# Patient Record
Sex: Male | Born: 1965 | Race: White | Hispanic: No | Marital: Married | Smoking: Former smoker
Health system: Southern US, Community
[De-identification: ages and names within clinical notes are randomized; demographics above are authoritative.]

## PROBLEM LIST (undated history)

## (undated) HISTORY — PX: FINGER SURGERY: SHX640

---
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
## 2008-10-07 ENCOUNTER — Emergency Department: Payer: Self-pay | Admitting: Emergency Medicine

## 2014-12-10 ENCOUNTER — Ambulatory Visit: Payer: Self-pay | Admitting: Family Medicine

## 2018-04-27 ENCOUNTER — Ambulatory Visit: Payer: Self-pay | Admitting: Family Medicine

## 2018-05-18 ENCOUNTER — Encounter: Payer: Self-pay | Admitting: Family Medicine

## 2018-05-18 ENCOUNTER — Encounter (INDEPENDENT_AMBULATORY_CARE_PROVIDER_SITE_OTHER): Payer: Self-pay

## 2018-05-18 ENCOUNTER — Ambulatory Visit (INDEPENDENT_AMBULATORY_CARE_PROVIDER_SITE_OTHER): Payer: BLUE CROSS/BLUE SHIELD | Admitting: Family Medicine

## 2018-05-18 VITALS — BP 170/96 | HR 75 | Temp 98.4°F | Ht 65.0 in | Wt 161.2 lb

## 2018-05-18 DIAGNOSIS — F411 Generalized anxiety disorder: Secondary | ICD-10-CM | POA: Diagnosis not present

## 2018-05-18 DIAGNOSIS — I10 Essential (primary) hypertension: Secondary | ICD-10-CM | POA: Diagnosis not present

## 2018-05-18 MED ORDER — LORAZEPAM 1 MG PO TABS: 0.5000 mg | ORAL_TABLET | Freq: Two times a day (BID) | ORAL | 0 refills | Status: DC | PRN

## 2018-05-18 MED ORDER — BUSPIRONE HCL 10 MG PO TABS: 10.0000 mg | ORAL_TABLET | Freq: Every day | ORAL | 3 refills | Status: DC

## 2018-05-18 MED ORDER — AMLODIPINE BESYLATE 5 MG PO TABS: 5.0000 mg | ORAL_TABLET | Freq: Every day | ORAL | 3 refills | Status: DC

## 2018-05-18 NOTE — Progress Notes (Signed)
BP (!) 170/96 (BP Location: Right Arm, Patient Position: Sitting, Cuff Size: Normal)   Pulse 75   Temp 98.4 F (36.9 C) (Oral)   Ht 5\' 5"  (1.651 m)   Wt 161 lb 4 oz (73.1 kg)   SpO2 97%   BMI 26.83 kg/m   BP 180/110 on recheck  CC: new pt to establish care Subjective:    Patient ID: Jonathon Gordon, male    DOB: 12/12/1965, 52 y.o.   MRN: [id number]  HPI: Jonathon Gordon is a 52 y.o. male presenting on 05/18/2018 for New Patient (Initial Visit) (Pt accompanied by fiance.)   Here with fiance Amy. No prior PCP. No recent CPE.   Takes calcium - suggested may stop this.   Elevated BP today - pt nervous. Fmhx HTN. Last time BP checked was 6 months ago. Occasional headaches. No vision changes, CP/tightness, SOB, leg swelling. High stress job.   High anxiety according to patient and fiancee. Started after bad divorce 8 yrs ago. Some anxiety attacks - racing heart, shortness of breath, tremors. Occurs during awkward situations. Happens 4-5 times a week. More stress at doctor's office, more stress with large crowds. Trying [facility] Wart OTC Family stress - trouble with son, lost father last year, mother in poor health. Stress relieving strategies. Upcoming vacation this week for 9 days. Tried neighbor's medicine which helped (unsure what it was).   High stress job at [employer].   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 Interim medical history since our last visit reviewed. Allergies and medications reviewed and updated. Outpatient Medications Prior to Visit  Medication Sig Dispense Refill  . Calcium Carbonate (CALCIUM OYSTER SHELL PO) Take 1 tablet by mouth  daily. 600 mg    . Cholecalciferol (VITAMIN D3) 2000 units TABS Take 1 tablet by mouth daily.    . Omega-3 Fatty Acids (OMEGA-3 FISH OIL) 1200 MG CAPS Take 1 capsule by mouth daily.    Marland Kitchen OVER THE COUNTER MEDICATION Take 2 capsules by mouth 2 (two) times daily. [facility] Wart 350 mg capsule    . OVER THE COUNTER MEDICATION Take 2 capsules by mouth 2 (two) times daily. Test X180 Alpha    . OVER THE COUNTER MEDICATION [employer] Mental Clarity     No facility-administered medications prior to visit.      Per HPI unless specifically indicated in ROS section below Review of Systems     Objective:    BP (!) 170/96 (BP Location: Right Arm, Patient Position: Sitting, Cuff Size: Normal)   Pulse 75   Temp 98.4 F (36.9 C) (Oral)   Ht 5\' 5"  (1.651 m)   Wt 161 lb 4 oz (73.1 kg)   SpO2 97%   BMI 26.83 kg/m   Wt Readings from Last 3 Encounters:  05/18/18 161 lb 4 oz (73.1 kg)    Physical Exam  Constitutional: He is oriented to person, place, and time. He appears well-developed and well-nourished. No distress.  HENT:  Head: Normocephalic and atraumatic.  Right Ear: Hearing, tympanic membrane, external ear and ear canal normal.  Left Ear: Hearing, tympanic membrane, external ear and ear canal normal.  Nose: Nose normal.  Mouth/Throat:  Uvula is midline, oropharynx is clear and moist and mucous membranes are normal. No oropharyngeal exudate, posterior oropharyngeal edema or posterior oropharyngeal erythema.  Eyes: Pupils are equal, round, and reactive to light. Conjunctivae and EOM are normal. No scleral icterus.  Neck: Normal range of motion. Neck supple.  Cardiovascular: Normal rate, regular rhythm, normal heart sounds and intact distal pulses.  No murmur heard. Pulses:      Radial pulses are 2+ on the right side, and 2+ on the left side.  Pulmonary/Chest: Effort normal and breath sounds normal. No respiratory distress. He has no wheezes. He has no rales.  Abdominal: Soft. Bowel  sounds are normal. He exhibits no distension and no mass. There is no tenderness. There is no rebound and no guarding.  Musculoskeletal: Normal range of motion. He exhibits no edema.  Lymphadenopathy:    He has no cervical adenopathy.  Neurological: He is alert and oriented to person, place, and time.  CN grossly intact, station and gait intact  Skin: Skin is warm and dry. No rash noted.  Psychiatric: He has a normal mood and affect. His behavior is normal. Judgment and thought content normal.  Nursing note and vitals reviewed.  No results found for this or any previous visit.   Depression screen PHQ 2/9 05/18/2018  Decreased Interest 1  Down, Depressed, Hopeless 1  PHQ - 2 Score 2  Altered sleeping 1  Tired, decreased energy 2  Change in appetite 1  Feeling bad or failure about yourself  2  Trouble concentrating 1  Moving slowly or fidgety/restless 0  Suicidal thoughts 0  PHQ-9 Score 9    GAD 7 : Generalized Anxiety Score 05/18/2018  Nervous, Anxious, on Edge 2  Control/stop worrying 2  Worry too much - different things 2  Trouble relaxing 2  Restless 1  Easily annoyed or irritable 2  Afraid - awful might happen 3  Total GAD 7 Score 14      Assessment & Plan:  Advised return in 1 month for f/u and CPE Over 45 minutes were spent face-to-face with the patient during this encounter and >50% of that time was spent on counseling and coordination of care  Problem List Items Addressed This Visit    Hypertension, essential - Primary    Endorses h/o elevated readings in the past, along with strong family history. Reviewed reasons to control blood pressure. I asked them to monitor BP more closely at home and if persistently elevated, to start amlodipine 5mg  daily - WASP provided today. Pt and fiancee agree with plan.       Relevant Medications   amLODipine (NORVASC) 5 MG tablet   GAD (generalized anxiety disorder)    Majority of visit spent discussing strsesors, anxiety, and  management of this. Reviewed in detail importance of stress relieving strategies, work- life balance, and healthy lifestyle to manage anxiety. Pt interested in further treatment for this - will Rx buspar 10mg  nightly over next few weeks, reviewed common side effects. Will Rx lorazepam PRN anxiety temporary measure while buspar takes effect. Discussed habit forming nature of this controlled substance. Encouraged sparing use. Recommended decreased alcohol use.  PHQ9 = 9 GAD7= 14      Relevant Medications   busPIRone (BUSPAR) 10 MG tablet   LORazepam (ATIVAN) 1 MG tablet       Meds ordered this encounter  Medications  . amLODipine (NORVASC) 5 MG tablet    Sig: Take 1 tablet (5 mg total) by mouth daily.  Dispense:  30 tablet    Refill:  3  . busPIRone (BUSPAR) 10 MG tablet    Sig: Take 1 tablet (10 mg total) by mouth at bedtime.    Dispense:  30 tablet    Refill:  3  . LORazepam (ATIVAN) 1 MG tablet    Sig: Take 0.5-1 tablets (0.5-1 mg total) by mouth 2 (two) times daily as needed for anxiety.    Dispense:  20 tablet    Refill:  0   No orders of the defined types were placed in this encounter.   Follow up plan: Return in about 1 month (around 06/15/2018) for annual exam, prior fasting for blood work.  Eustaquio Boyden, MD

## 2018-05-18 NOTE — Patient Instructions (Signed)
Good to meet you today. Work on stress relieving strategies as discussed Start buspar 10mg  nightly for anxiety - this will take several weeks to take full effect, let us know if not tolerating well. May take lorazepam 1/2-1 tablet as needed for anxiety.  Blood pressure is staying elevated - I've printed out prescription for amlodipine 5mg  daily - start taking if blood pressure stays elevated over the next week at home - buy blood pressure cuff. Increase water, increase potassium rich foods (fruits/vegetables), limit salt and sodium in the diet.   Hypertension Hypertension, commonly called high blood pressure, is when the force of blood pumping through the arteries is too strong. The arteries are the blood vessels that carry blood from the heart throughout the body. Hypertension forces the heart to work harder to pump blood and may cause arteries to become narrow or stiff. Having untreated or uncontrolled hypertension can cause heart attacks, strokes, kidney disease, and other problems. A blood pressure reading consists of a higher number over a lower number. Ideally, your blood pressure should be below 120/80. The first ("top") number is called the systolic pressure. It is a measure of the pressure in your arteries as your heart beats. The second ("bottom") number is called the diastolic pressure. It is a measure of the pressure in your arteries as the heart relaxes. What are the causes? The cause of this condition is not known. What increases the risk? Some risk factors for high blood pressure are under your control. Others are not. Factors you can change  Smoking.  Having type 2 diabetes mellitus, high cholesterol, or both.  Not getting enough exercise or physical activity.  Being overweight.  Having too much fat, sugar, calories, or salt (sodium) in your diet.  Drinking too much alcohol. Factors that are difficult or impossible to change  Having chronic kidney disease.  Having a  family history of high blood pressure.  Age. Risk increases with age.  Race. You may be at higher risk if you are African-American.  Gender. Men are at higher risk than women before age 54. After age 465, women are at higher risk than men.  Having obstructive sleep apnea.  Stress. What are the signs or symptoms? Extremely high blood pressure (hypertensive crisis) may cause:  Headache.  Anxiety.  Shortness of breath.  Nosebleed.  Nausea and vomiting.  Severe chest pain.  Jerky movements you cannot control (seizures).  How is this diagnosed? This condition is diagnosed by measuring your blood pressure while you are seated, with your arm resting on a surface. The cuff of the blood pressure monitor will be placed directly against the skin of your upper arm at the level of your heart. It should be measured at least twice using the same arm. Certain conditions can cause a difference in blood pressure between your right and left arms. Certain factors can cause blood pressure readings to be lower or higher than normal (elevated) for a short period of time:  When your blood pressure is higher when you are in a health care provider's office than when you are at home, this is called white coat hypertension. Most people with this condition do not need medicines.  When your blood pressure is higher at home than when you are in a health care provider's office, this is called masked hypertension. Most people with this condition may need medicines to control blood pressure.  If you have a high blood pressure reading during one visit or you have normal blood  pressure with other risk factors:  You may be asked to return on a different day to have your blood pressure checked again.  You may be asked to monitor your blood pressure at home for 1 week or longer.  If you are diagnosed with hypertension, you may have other blood or imaging tests to help your health care provider understand your  overall risk for other conditions. How is this treated? This condition is treated by making healthy lifestyle changes, such as eating healthy foods, exercising more, and reducing your alcohol intake. Your health care provider may prescribe medicine if lifestyle changes are not enough to get your blood pressure under control, and if:  Your systolic blood pressure is above 130.  Your diastolic blood pressure is above 80.  Your personal target blood pressure may vary depending on your medical conditions, your age, and other factors. Follow these instructions at home: Eating and drinking  Eat a diet that is high in fiber and potassium, and low in sodium, added sugar, and fat. An example eating plan is called the DASH (Dietary Approaches to Stop Hypertension) diet. To eat this way: ? Eat plenty of fresh fruits and vegetables. Try to fill half of your plate at each meal with fruits and vegetables. ? Eat whole grains, such as whole wheat pasta, brown rice, or whole grain bread. Fill about one quarter of your plate with whole grains. ? Eat or drink low-fat dairy products, such as skim milk or low-fat yogurt. ? Avoid fatty cuts of meat, processed or cured meats, and poultry with skin. Fill about one quarter of your plate with lean proteins, such as fish, chicken without skin, beans, eggs, and tofu. ? Avoid premade and processed foods. These tend to be higher in sodium, added sugar, and fat.  Reduce your daily sodium intake. Most people with hypertension should eat less than 1,500 mg of sodium a day.  Limit alcohol intake to no more than 1 drink a day for nonpregnant women and 2 drinks a day for men. One drink equals 12 oz of beer, 5 oz of wine, or 1 oz of hard liquor. Lifestyle  Work with your health care provider to maintain a healthy body weight or to lose weight. Ask what an ideal weight is for you.  Get at least 30 minutes of exercise that causes your heart to beat faster (aerobic exercise)  most days of the week. Activities may include walking, swimming, or biking.  Include exercise to strengthen your muscles (resistance exercise), such as pilates or lifting weights, as part of your weekly exercise routine. Try to do these types of exercises for 30 minutes at least 3 days a week.  Do not use any products that contain nicotine or tobacco, such as cigarettes and e-cigarettes. If you need help quitting, ask your health care provider.  Monitor your blood pressure at home as told by your health care provider.  Keep all follow-up visits as told by your health care provider. This is important. Medicines  Take over-the-counter and prescription medicines only as told by your health care provider. Follow directions carefully. Blood pressure medicines must be taken as prescribed.  Do not skip doses of blood pressure medicine. Doing this puts you at risk for problems and can make the medicine less effective.  Ask your health care provider about side effects or reactions to medicines that you should watch for. Contact a health care provider if:  You think you are having a reaction to a medicine  you are taking.  You have headaches that keep coming back (recurring).  You feel dizzy.  You have swelling in your ankles.  You have trouble with your vision. Get help right away if:  You develop a severe headache or confusion.  You have unusual weakness or numbness.  You feel faint.  You have severe pain in your chest or abdomen.  You vomit repeatedly.  You have trouble breathing. Summary  Hypertension is when the force of blood pumping through your arteries is too strong. If this condition is not controlled, it may put you at risk for serious complications.  Your personal target blood pressure may vary depending on your medical conditions, your age, and other factors. For most people, a normal blood pressure is less than 120/80.  Hypertension is treated with lifestyle changes,  medicines, or a combination of both. Lifestyle changes include weight loss, eating a healthy, low-sodium diet, exercising more, and limiting alcohol. This information is not intended to replace advice given to you by your health care provider. Make sure you discuss any questions you have with your health care provider. Document Released: 11/07/2005 Document Revised: 10/05/2016 Document Reviewed: 10/05/2016 Elsevier Interactive Patient Education  [employer].

## 2018-05-21 ENCOUNTER — Encounter: Payer: Self-pay | Admitting: Family Medicine

## 2018-05-21 DIAGNOSIS — F411 Generalized anxiety disorder: Secondary | ICD-10-CM

## 2018-05-21 DIAGNOSIS — I10 Essential (primary) hypertension: Secondary | ICD-10-CM | POA: Insufficient documentation

## 2018-05-21 HISTORY — DX: Generalized anxiety disorder: F41.1

## 2018-05-21 HISTORY — DX: Essential (primary) hypertension: I10

## 2018-05-21 NOTE — Assessment & Plan Note (Signed)
Endorses h/o elevated readings in the past, along with strong family history. Reviewed reasons to control blood pressure. I asked them to monitor BP more closely at home and if persistently elevated, to start amlodipine 5mg  daily - WASP provided today. Pt and fiancee agree with plan.

## 2018-05-21 NOTE — Assessment & Plan Note (Signed)
Majority of visit spent discussing strsesors, anxiety, and management of this. Reviewed in detail importance of stress relieving strategies, work- life balance, and healthy lifestyle to manage anxiety. Pt interested in further treatment for this - will Rx buspar 10mg  nightly over next few weeks, reviewed common side effects. Will Rx lorazepam PRN anxiety temporary measure while buspar takes effect. Discussed habit forming nature of this controlled substance. Encouraged sparing use. Recommended decreased alcohol use.  PHQ9 = 9 GAD7= 14

## 2018-06-14 ENCOUNTER — Other Ambulatory Visit: Payer: Self-pay | Admitting: Family Medicine

## 2018-06-14 DIAGNOSIS — I10 Essential (primary) hypertension: Secondary | ICD-10-CM

## 2018-06-14 DIAGNOSIS — Z13220 Encounter for screening for lipoid disorders: Secondary | ICD-10-CM

## 2018-06-14 DIAGNOSIS — Z125 Encounter for screening for malignant neoplasm of prostate: Secondary | ICD-10-CM

## 2018-06-15 ENCOUNTER — Other Ambulatory Visit (INDEPENDENT_AMBULATORY_CARE_PROVIDER_SITE_OTHER): Payer: BLUE CROSS/BLUE SHIELD

## 2018-06-15 DIAGNOSIS — I10 Essential (primary) hypertension: Secondary | ICD-10-CM | POA: Diagnosis not present

## 2018-06-15 DIAGNOSIS — Z125 Encounter for screening for malignant neoplasm of prostate: Secondary | ICD-10-CM

## 2018-06-15 DIAGNOSIS — Z13220 Encounter for screening for lipoid disorders: Secondary | ICD-10-CM

## 2018-06-15 LAB — LIPID PANEL
CHOLESTEROL: 193 mg/dL (ref 0–200)
HDL: 91.7 mg/dL (ref >39.00)
LDL CALC: 91 mg/dL (ref 0–99)
NonHDL: 101.28
TRIGLYCERIDES: 52 mg/dL (ref 0.0–149.0)
Total CHOL/HDL Ratio: 2
VLDL: 10.4 mg/dL (ref 0.0–40.0)

## 2018-06-15 LAB — PSA: PSA: 0.32 ng/mL (ref 0.10–4.00)

## 2018-06-15 LAB — MICROALBUMIN / CREATININE URINE RATIO
Creatinine,U: 53.1 mg/dL
Microalb Creat Ratio: 1.3 mg/g (ref 0.0–30.0)
Microalb, Ur: <0.7 mg/dL (ref 0.0–1.9)

## 2018-06-15 LAB — COMPREHENSIVE METABOLIC PANEL
ALBUMIN: 4.7 g/dL (ref 3.5–5.2)
ALT: 23 U/L (ref 0–53)
AST: 22 U/L (ref 0–37)
Alkaline Phosphatase: 67 U/L (ref 39–117)
BUN: 21 mg/dL (ref 6–23)
CALCIUM: 9.4 mg/dL (ref 8.4–10.5)
CO2: 26 mEq/L (ref 19–32)
Chloride: 100 mEq/L (ref 96–112)
Creatinine, Ser: 1.22 mg/dL (ref 0.40–1.50)
GFR: 65.76 mL/min (ref >60.00)
Glucose, Bld: 147 mg/dL — ABNORMAL HIGH (ref 70–99)
POTASSIUM: 4.2 meq/L (ref 3.5–5.1)
Sodium: 138 mEq/L (ref 135–145)
Total Bilirubin: 0.7 mg/dL (ref 0.2–1.2)
Total Protein: 7.6 g/dL (ref 6.0–8.3)

## 2018-06-19 ENCOUNTER — Ambulatory Visit (INDEPENDENT_AMBULATORY_CARE_PROVIDER_SITE_OTHER): Payer: BLUE CROSS/BLUE SHIELD | Admitting: Family Medicine

## 2018-06-19 ENCOUNTER — Encounter: Payer: Self-pay | Admitting: Family Medicine

## 2018-06-19 VITALS — BP 140/88 | HR 78 | Temp 98.2°F | Ht 65.0 in | Wt 161.8 lb

## 2018-06-19 DIAGNOSIS — Z23 Encounter for immunization: Secondary | ICD-10-CM

## 2018-06-19 DIAGNOSIS — Z0000 Encounter for general adult medical examination without abnormal findings: Secondary | ICD-10-CM | POA: Diagnosis not present

## 2018-06-19 DIAGNOSIS — R739 Hyperglycemia, unspecified: Secondary | ICD-10-CM

## 2018-06-19 DIAGNOSIS — Z1211 Encounter for screening for malignant neoplasm of colon: Secondary | ICD-10-CM

## 2018-06-19 DIAGNOSIS — F411 Generalized anxiety disorder: Secondary | ICD-10-CM

## 2018-06-19 DIAGNOSIS — I10 Essential (primary) hypertension: Secondary | ICD-10-CM

## 2018-06-19 DIAGNOSIS — R7303 Prediabetes: Secondary | ICD-10-CM | POA: Insufficient documentation

## 2018-06-19 LAB — POCT GLYCOSYLATED HEMOGLOBIN (HGB A1C): HEMOGLOBIN A1C: 5.2 % (ref 4.0–5.6)

## 2018-06-19 MED ORDER — BUSPIRONE HCL 10 MG PO TABS: 10.0000 mg | ORAL_TABLET | Freq: Two times a day (BID) | ORAL | 6 refills | Status: DC

## 2018-06-19 MED ORDER — LORAZEPAM 1 MG PO TABS: 0.5000 mg | ORAL_TABLET | Freq: Two times a day (BID) | ORAL | 0 refills | Status: DC | PRN

## 2018-06-19 MED ORDER — AMLODIPINE BESYLATE 5 MG PO TABS: 5.0000 mg | ORAL_TABLET | Freq: Every day | ORAL | 3 refills | Status: DC

## 2018-06-19 NOTE — Progress Notes (Signed)
BP 140/88 (BP Location: Right Arm, Patient Position: Sitting, Cuff Size: Normal)   Pulse 78   Temp 98.2 F (36.8 C) (Oral)   Ht 5\' 5"  (1.651 m)   Wt 161 lb 12 oz (73.4 kg)   SpO2 97%   BMI 26.92 kg/m    CC: CPE Subjective:    Patient ID: Jonathon Gordon, male    DOB: September 29, 1966, 52 y.o.   MRN: [id number]  HPI: Jonathon Gordon is a 52 y.o. male presenting on 06/19/2018 for Annual Exam (Wants to discuss lorazepam.)   See prior note for details. Last visit we started amlodipine for elevated blood pressures as well as buspar and lorazepam for GAD with high work stress. Overall doing well. 1 wk vacation helped.   HTN - bp mostly running 130 systolic. Tolerating amlodipine well.   Preventative: Colon cancer screening - discussed, would like colonoscopy Prostate cancer screening - discussed. Agrees to PSA/DRE today.  Flu shot - hasn't received Tdap today Seat belt use discussed Sunscreen use discussed. No changing moles on skin. 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 Interim medical history since our last visit reviewed. Allergies and medications reviewed and updated. Outpatient Medications Prior to Visit  Medication Sig Dispense Refill  . Cholecalciferol (VITAMIN D3) 2000 units TABS Take 1 tablet by mouth daily.    . Omega-3 Fatty Acids (OMEGA-3 FISH OIL) 1200 MG CAPS Take 1 capsule by mouth daily.    Marland Kitchen OVER THE COUNTER MEDICATION Take 2 capsules by mouth 2 (two) times daily. Test X180 Alpha    . amLODipine (NORVASC) 5 MG tablet Take 1 tablet (5 mg total) by mouth daily. 30 tablet 3  . busPIRone  (BUSPAR) 10 MG tablet Take 1 tablet (10 mg total) by mouth at bedtime. 30 tablet 3  . LORazepam (ATIVAN) 1 MG tablet Take 0.5-1 tablets (0.5-1 mg total) by mouth 2 (two) times daily as needed for anxiety. 20 tablet 0  . Calcium Carbonate (CALCIUM OYSTER SHELL PO) Take 1 tablet by mouth daily. 600 mg    . OVER THE COUNTER MEDICATION Take 2 capsules by mouth 2 (two) times daily. [facility] Wart 350 mg capsule    . OVER THE COUNTER MEDICATION [facility] Mental Clarity     No facility-administered medications prior to visit.      Per HPI unless specifically indicated in ROS section below Review of Systems  Constitutional: Negative for activity change, appetite change, chills, fatigue, fever and unexpected weight change.  HENT: Negative for hearing loss.   Eyes: Negative for visual disturbance.  Respiratory: Positive for cough (intermittent). Negative for chest tightness, shortness of breath and wheezing.   Cardiovascular: Positive for leg swelling (mild, amlodipine related). Negative for chest pain and palpitations.  Gastrointestinal: Negative for abdominal distention, abdominal pain, blood in stool, constipation, diarrhea, nausea and vomiting.  Genitourinary: Negative for difficulty urinating and hematuria.  Musculoskeletal: Negative for arthralgias, myalgias and neck pain.  Skin: Negative for rash.  Neurological: Negative for dizziness, seizures, syncope and headaches.  Hematological: Negative for adenopathy. Does not bruise/bleed easily.  Psychiatric/Behavioral: Negative for  dysphoric mood. The patient is not nervous/anxious.        Objective:    BP 140/88 (BP Location: Right Arm, Patient Position: Sitting, Cuff Size: Normal)   Pulse 78   Temp 98.2 F (36.8 C) (Oral)   Ht 5\' 5"  (1.651 m)   Wt 161 lb 12 oz (73.4 kg)   SpO2 97%   BMI 26.92 kg/m   Wt Readings from Last 3 Encounters:  06/19/18 161 lb 12 oz (73.4 kg)  05/18/18 161 lb 4 oz (73.1 kg)    Physical Exam    Constitutional: He is oriented to person, place, and time. He appears well-developed and well-nourished. No distress.  HENT:  Head: Normocephalic and atraumatic.  Right Ear: Hearing, tympanic membrane, external ear and ear canal normal.  Left Ear: Hearing, tympanic membrane, external ear and ear canal normal.  Nose: Nose normal.  Mouth/Throat: Uvula is midline, oropharynx is clear and moist and mucous membranes are normal. No oropharyngeal exudate, posterior oropharyngeal edema or posterior oropharyngeal erythema.  Eyes: Pupils are equal, round, and reactive to light. Conjunctivae and EOM are normal. No scleral icterus.  Neck: Normal range of motion. Neck supple.  Cardiovascular: Normal rate, regular rhythm, normal heart sounds and intact distal pulses.  No murmur heard. Pulses:      Radial pulses are 2+ on the right side, and 2+ on the left side.  Pulmonary/Chest: Effort normal and breath sounds normal. No respiratory distress. He has no wheezes. He has no rales.  Abdominal: Soft. Bowel sounds are normal. He exhibits no distension and no mass. There is no tenderness. There is no rebound and no guarding.  Genitourinary: Rectum normal and prostate normal. Rectal exam shows no external hemorrhoid, no internal hemorrhoid, no fissure, no mass, no tenderness and anal tone normal. Prostate is not enlarged (20gm) and not tender.  Musculoskeletal: Normal range of motion. He exhibits no edema.  Lymphadenopathy:    He has no cervical adenopathy.  Neurological: He is alert and oriented to person, place, and time.  CN grossly intact, station and gait intact  Skin: Skin is warm and dry. No rash noted.  Psychiatric: He has a normal mood and affect. His behavior is normal. Judgment and thought content normal.  Nursing note and vitals reviewed.  Results for orders placed or performed in visit on 06/15/18  Microalbumin / creatinine urine ratio  Result Value Ref Range   Microalb, Ur <0.7 0.0 - 1.9 mg/dL    Creatinine,U 16.1 mg/dL   Microalb Creat Ratio 1.3 0.0 - 30.0 mg/g  PSA  Result Value Ref Range   PSA 0.32 0.10 - 4.00 ng/mL  Lipid panel  Result Value Ref Range   Cholesterol 193 0 - 200 mg/dL   Triglycerides 09.6 0.0 - 149.0 mg/dL   HDL 04.54 >09.81 mg/dL   VLDL 19.1 0.0 - 47.8 mg/dL   LDL Cholesterol 91 0 - 99 mg/dL   Total CHOL/HDL Ratio 2    NonHDL 101.28   Comprehensive metabolic panel  Result Value Ref Range   Sodium 138 135 - 145 mEq/L   Potassium 4.2 3.5 - 5.1 mEq/L   Chloride 100 96 - 112 mEq/L   CO2 26 19 - 32 mEq/L   Glucose, Bld 147 (H) 70 - 99 mg/dL   BUN 21 6 - 23 mg/dL   Creatinine, Ser 2.95 0.40 - 1.50 mg/dL   Total Bilirubin 0.7 0.2 - 1.2 mg/dL   Alkaline Phosphatase 67 39 - 117 U/L   AST  22 0 - 37 U/L   ALT 23 0 - 53 U/L   Total Protein 7.6 6.0 - 8.3 g/dL   Albumin 4.7 3.5 - 5.2 g/dL   Calcium 9.4 8.4 - 16.110.5 mg/dL   GFR 09.6065.76 >45.40>60.00 mL/min      Assessment & Plan:   Problem List Items Addressed This Visit    Hypertension, essential    Marked improvement with healthy changes as well as amlodipine on board. Continue      Relevant Medications   amLODipine (NORVASC) 5 MG tablet   Hyperglycemia    New. Fasting cbg 147. Check A1c today.       Relevant Orders   POCT glycosylated hemoglobin (Hb A1C)   Health maintenance examination - Primary    Preventative protocols reviewed and updated unless pt declined. Discussed healthy diet and lifestyle.       GAD (generalized anxiety disorder)    Improvement on current regimen - will increase buspar to 5/10 x1 wk then 10mg  BID. Continue lorazepam. Aware goal is to taper off benzo.       Relevant Medications   busPIRone (BUSPAR) 10 MG tablet   LORazepam (ATIVAN) 1 MG tablet    Other Visit Diagnoses    Special screening for malignant neoplasms, colon       Relevant Orders   Ambulatory referral to Gastroenterology   Need for Tdap vaccination       Relevant Orders   Tdap vaccine greater than or equal  to 7yo IM (Completed)       Meds ordered this encounter  Medications  . busPIRone (BUSPAR) 10 MG tablet    Sig: Take 1 tablet (10 mg total) by mouth 2 (two) times daily.    Dispense:  60 tablet    Refill:  6  . amLODipine (NORVASC) 5 MG tablet    Sig: Take 1 tablet (5 mg total) by mouth daily.    Dispense:  90 tablet    Refill:  3  . LORazepam (ATIVAN) 1 MG tablet    Sig: Take 0.5-1 tablets (0.5-1 mg total) by mouth 2 (two) times daily as needed for anxiety.    Dispense:  20 tablet    Refill:  0   Orders Placed This Encounter  Procedures  . Tdap vaccine greater than or equal to 7yo IM  . Ambulatory referral to Gastroenterology    Referral Priority:   Routine    Referral Type:   Consultation    Referral Reason:   Specialty Services Required    Number of Visits Requested:   1  . POCT glycosylated hemoglobin (Hb A1C)    Follow up plan: No follow-ups on file.  Eustaquio BoydenJavier Zyan Coby, MD

## 2018-06-19 NOTE — Patient Instructions (Addendum)
Increase buspar to 1/2 tab in the morning and 1 tab at night.  Continue amlodipine 5mg  daily. I've refilled another month of lorazepam - goal is to eventually come off this medicine.  Continue working on healthy stress relieving strategies.  Tdap today (tetanus and whooping cough).  We will refer you to GI doctors in  [place].   Health Maintenance, Male A healthy lifestyle and preventive care is important for your health and wellness. Ask your health care provider about what schedule of regular examinations is right for you. What should I know about weight and diet? Eat a Healthy Diet  Eat plenty of vegetables, fruits, whole grains, low-fat dairy products, and lean protein.  Do not eat a lot of foods high in solid fats, added sugars, or salt.  Maintain a Healthy Weight Regular exercise can help you achieve or maintain a healthy weight. You should:  Do at least 150 minutes of exercise each week. The exercise should increase your heart rate and make you sweat (moderate-intensity exercise).  Do strength-training exercises at least twice a week.  Watch Your Levels of Cholesterol and Blood Lipids  Have your blood tested for lipids and cholesterol every 5 years starting at 54 years of age. If you are at high risk for heart disease, you should start having your blood tested when you are 54 years old. You may need to have your cholesterol levels checked more often if: ? Your lipid or cholesterol levels are high. ? You are older than 54 years of age. ? You are at high risk for heart disease.  What should I know about cancer screening? Many types of cancers can be detected early and may often be prevented. Lung Cancer  You should be screened every year for lung cancer if: ? You are a current smoker who has smoked for at least 30 years. ? You are a former smoker who has quit within the past 15 years.  Talk to your health care provider about your screening options, when you should start  screening, and how often you should be screened.  Colorectal Cancer  Routine colorectal cancer screening usually begins at 54 years of age and should be repeated every 5-10 years until you are 54 years old. You may need to be screened more often if early forms of precancerous polyps or small growths are found. Your health care provider may recommend screening at an earlier age if you have risk factors for colon cancer.  Your health care provider may recommend using home test kits to check for hidden blood in the stool.  A small camera at the end of a tube can be used to examine your colon (sigmoidoscopy or colonoscopy). This checks for the earliest forms of colorectal cancer.  Prostate and Testicular Cancer  Depending on your age and overall health, your health care provider may do certain tests to screen for prostate and testicular cancer.  Talk to your health care provider about any symptoms or concerns you have about testicular or prostate cancer.  Skin Cancer  Check your skin from head to toe regularly.  Tell your health care provider about any new moles or changes in moles, especially if: ? There is a change in a mole's size, shape, or color. ? You have a mole that is larger than a pencil eraser.  Always use sunscreen. Apply sunscreen liberally and repeat throughout the day.  Protect yourself by wearing long sleeves, pants, a wide-brimmed hat, and sunglasses when outside.  What should I  know about heart disease, diabetes, and high blood pressure?  If you are 75-55 years of age, have your blood pressure checked every 3-5 years. If you are 39 years of age or older, have your blood pressure checked every year. You should have your blood pressure measured twice-once when you are at a hospital or clinic, and once when you are not at a hospital or clinic. Record the average of the two measurements. To check your blood pressure when you are not at a hospital or clinic, you can use: ? An  automated blood pressure machine at a pharmacy. ? A home blood pressure monitor.  Talk to your health care provider about your target blood pressure.  If you are between 56-96 years old, ask your health care provider if you should take aspirin to prevent heart disease.  Have regular diabetes screenings by checking your fasting blood sugar level. ? If you are at a normal weight and have a low risk for diabetes, have this test once every three years after the age of 41. ? If you are overweight and have a high risk for diabetes, consider being tested at a younger age or more often.  A one-time screening for abdominal aortic aneurysm (AAA) by ultrasound is recommended for men aged 61-75 years who are current or former smokers. What should I know about preventing infection? Hepatitis B If you have a higher risk for hepatitis B, you should be screened for this virus. Talk with your health care provider to find out if you are at risk for hepatitis B infection. Hepatitis C Blood testing is recommended for:  Everyone born from [birth year] through 1967.  Anyone with known risk factors for hepatitis C.  Sexually Transmitted Diseases (STDs)  You should be screened each year for STDs including gonorrhea and chlamydia if: ? You are sexually active and are younger than 54 years of age. ? You are older than 54 years of age and your health care provider tells you that you are at risk for this type of infection. ? Your sexual activity has changed since you were last screened and you are at an increased risk for chlamydia or gonorrhea. Ask your health care provider if you are at risk.  Talk with your health care provider about whether you are at high risk of being infected with HIV. Your health care provider may recommend a prescription medicine to help prevent HIV infection.  What else can I do?  Schedule regular health, dental, and eye exams.  Stay current with your vaccines (immunizations).  Do not use  any tobacco products, such as cigarettes, chewing tobacco, and e-cigarettes. If you need help quitting, ask your health care provider.  Limit alcohol intake to no more than 2 drinks per day. One drink equals 12 ounces of beer, 5 ounces of wine, or 1 ounces of hard liquor.  Do not use street drugs.  Do not share needles.  Ask your health care provider for help if you need support or information about quitting drugs.  Tell your health care provider if you often feel depressed.  Tell your health care provider if you have ever been abused or do not feel safe at home. This information is not intended to replace advice given to you by your health care provider. Make sure you discuss any questions you have with your health care provider. Document Released: 05/05/2008 Document Revised: 07/06/2016 Document Reviewed: 08/11/2015 Elsevier Interactive Patient Education  [employer].

## 2018-06-19 NOTE — Assessment & Plan Note (Signed)
Improvement on current regimen - will increase buspar to 5/10 x1 wk then 10mg  BID. Continue lorazepam. Aware goal is to taper off benzo.

## 2018-06-19 NOTE — Assessment & Plan Note (Signed)
Preventative protocols reviewed and updated unless pt declined. Discussed healthy diet and lifestyle.  

## 2018-06-19 NOTE — Assessment & Plan Note (Signed)
Marked improvement with healthy changes as well as amlodipine on board. Continue

## 2018-06-19 NOTE — Assessment & Plan Note (Signed)
New. Fasting cbg 147. Check A1c today.

## 2018-07-05 ENCOUNTER — Encounter: Payer: Self-pay | Admitting: *Deleted

## 2018-07-26 ENCOUNTER — Other Ambulatory Visit: Payer: Self-pay | Admitting: Family Medicine

## 2018-07-26 NOTE — Telephone Encounter (Addendum)
I spoke with Joni Reining at [facility] and she inactivated old buspar rx. Joni Reining did have the 06/19/18 Buspar rx and would fill today. Pt notified and voiced understanding and very appreciate.

## 2018-07-26 NOTE — Telephone Encounter (Signed)
Attempted to contact pt regarding refill request for buspirone; records show prescription ordered 06/19/18 #60 with 6 refills; message left on voicemail [phone] for pt to contact pharmacy for refill; also left instructions to call office if any further questions.

## 2018-07-26 NOTE — Telephone Encounter (Signed)
Copied from CRM [id number]. Topic: Quick Communication - Rx Refill/Question >> Jul 26, 2018  1:05 PM Tamela Oddi wrote: Medication: busPIRone (BUSPAR) 10 MG tablet  Patient called to request a refill for the above medication.  CB# [phone]  Preferred Pharmacy (with phone number or street name): [phone], [place] - [address] [phone] (Phone) [phone] (Fax)

## 2018-07-26 NOTE — Telephone Encounter (Signed)
Patient states that the pharmacy has an old prescription to take just one a day and too soon to refill. He is taking two a day per Dr Reece Agar about a month ago at his last visit. Please advise.

## 2018-08-20 ENCOUNTER — Other Ambulatory Visit: Payer: Self-pay | Admitting: Family Medicine

## 2018-08-27 ENCOUNTER — Other Ambulatory Visit: Payer: Self-pay | Admitting: Family Medicine

## 2018-08-27 NOTE — Telephone Encounter (Signed)
Requested medication (s) are due for refill today: Yes  Requested medication (s) are on the active medication list: Yes  Last refill:  06/19/18  Future visit scheduled: No  Notes to clinic:  See request    Requested Prescriptions  Pending Prescriptions Disp Refills   LORazepam (ATIVAN) 1 MG tablet 20 tablet 0    Sig: Take 0.5-1 tablets (0.5-1 mg total) by mouth 2 (two) times daily as needed for anxiety.     Not Delegated - Psychiatry:  Anxiolytics/Hypnotics Failed - 08/27/2018  2:05 PM      Failed - This refill cannot be delegated      Failed - Urine Drug Screen completed in last 360 days.      Passed - Valid encounter within last 6 months    Recent Outpatient Visits          2 months ago Health maintenance examination   [facility], Wynona Canes, MD   3 months ago Hypertension, essential   [facility], Wynona Canes, MD

## 2018-08-27 NOTE — Telephone Encounter (Signed)
Name of Medication:Lorazepam 1 mg Name of Pharmacy: [facility] Last Fill or Written Date and Quantity: # 20 on 06/19/18 Last Office Visit and Type: 06/19/18 annual Next Office Visit and Type: none scheduled

## 2018-08-27 NOTE — Telephone Encounter (Signed)
Copied from CRM [id number]. Topic: Quick Communication - Rx Refill/Question >> Aug 27, 2018  1:51 PM Stephannie Li, NT wrote: Medication LORazepam (ATIVAN) 1 MG tablet  Has the patient contacted their pharmacy? no (Agent: If no, request that the patient contact the pharmacy for the refill. (Agent: If yes, when and what did the pharmacy advise?  Preferred Pharmacy (with phone number or street name  [phone], [place] - [address] [phone] (Phone) [phone] (Fax)    Agent: Please be advised that RX refills may take up to 3 business days. We ask that you follow-up with your pharmacy.

## 2018-08-29 MED ORDER — LORAZEPAM 1 MG PO TABS: 0.5000 mg | ORAL_TABLET | Freq: Two times a day (BID) | ORAL | 0 refills | Status: DC | PRN

## 2018-08-29 NOTE — Telephone Encounter (Signed)
Pt notified lorazepam sent electronically to [facility]. Pt voiced understanding.

## 2018-08-29 NOTE — Telephone Encounter (Signed)
Eprescribed.

## 2018-11-10 ENCOUNTER — Other Ambulatory Visit: Payer: Self-pay | Admitting: Family Medicine

## 2018-11-12 NOTE — Telephone Encounter (Signed)
Name of Medication: lorazepam 1 mg Name of Pharmacy:[facility]  Last Fill or Written Date and Quantity:# 20 on 08/29/18 Last Office Visit and Type: 06/19/18 annual Next Office Visit and Type: none scheduled

## 2018-11-13 NOTE — Telephone Encounter (Signed)
Eprescribed.

## 2019-02-02 ENCOUNTER — Other Ambulatory Visit: Payer: Self-pay | Admitting: Family Medicine

## 2019-02-04 NOTE — Telephone Encounter (Signed)
Last filled 11-13-18 #20 Last OV 06-19-18 No Future OV [facility]

## 2019-02-05 NOTE — Telephone Encounter (Signed)
Eprescribed.

## 2019-02-18 ENCOUNTER — Other Ambulatory Visit: Payer: Self-pay | Admitting: Family Medicine

## 2019-05-16 ENCOUNTER — Encounter: Payer: Self-pay | Admitting: Family Medicine

## 2019-05-16 ENCOUNTER — Ambulatory Visit (INDEPENDENT_AMBULATORY_CARE_PROVIDER_SITE_OTHER): Payer: BC Managed Care – PPO | Admitting: Family Medicine

## 2019-05-16 ENCOUNTER — Other Ambulatory Visit: Payer: Self-pay

## 2019-05-16 ENCOUNTER — Ambulatory Visit (INDEPENDENT_AMBULATORY_CARE_PROVIDER_SITE_OTHER)
Admission: RE | Admit: 2019-05-16 | Discharge: 2019-05-16 | Disposition: A | Discharge status: Home / Self Care | Payer: BC Managed Care – PPO | Source: Ambulatory Visit | Attending: Family Medicine | Admitting: Family Medicine

## 2019-05-16 VITALS — BP 124/72 | HR 96 | Temp 98.7°F | Ht 65.0 in | Wt 177.3 lb

## 2019-05-16 DIAGNOSIS — M7062 Trochanteric bursitis, left hip: Secondary | ICD-10-CM

## 2019-05-16 DIAGNOSIS — M79645 Pain in left finger(s): Secondary | ICD-10-CM | POA: Diagnosis not present

## 2019-05-16 DIAGNOSIS — M1812 Unilateral primary osteoarthritis of first carpometacarpal joint, left hand: Secondary | ICD-10-CM | POA: Insufficient documentation

## 2019-05-16 DIAGNOSIS — M19042 Primary osteoarthritis, left hand: Secondary | ICD-10-CM | POA: Diagnosis not present

## 2019-05-16 DIAGNOSIS — M7072 Other bursitis of hip, left hip: Secondary | ICD-10-CM | POA: Insufficient documentation

## 2019-05-16 NOTE — Progress Notes (Signed)
This visit was conducted in person.  BP 124/72 (BP Location: Left Arm, Patient Position: Sitting, Cuff Size: Normal)   Pulse 96   Temp 98.7 F (37.1 C) (Temporal)   Ht 5\' 5"  (1.651 m)   Wt 177 lb 5 oz (80.4 kg)   SpO2 97%   BMI 29.51 kg/m    CC: L hip pain, R wrist pain Subjective:    Patient ID: Jonathon Gordon, male    DOB: 1966/09/29, 53 y.o.   MRN: [id number]  HPI: Jonathon MesiJames D Mathers is a [age] y.o. male presenting on 05/16/2019 for Hip Pain (C/o left hip pain. Denies injury. Started about 2 mos ago, worsening. Pain is worse using stairs. ) and Joint Swelling (C/o wrist pain. Noticed a knot on base of thumb. Started about 2 mos ago. )   L hip pain for 2 months, denies inciting trauma or injury. 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 Points to lateral hip, worse with squats and going up and down stairs. Some shooting pain down to knee. Some lower back pain which is chronic in nature. Denies numbness or paresthesias of leg. No right sided discomfort.   L  wrist pain for 2+ months wonders if typing at work is aggravating issue. Tried topical cream without benefit, ibuprofen with temporary relief. More pain with certain movements.      Relevant past medical, surgical, family and social history reviewed and updated as indicated. Interim medical history since our last visit reviewed. Allergies and medications reviewed and updated. Outpatient Medications Prior to Visit  Medication Sig Dispense Refill  . amLODipine (NORVASC) 5 MG tablet Take 1 tablet (5 mg total) by mouth daily. 90 tablet 3  . busPIRone (BUSPAR) 10 MG tablet TAKE 1 TABLET BY MOUTH TWICE A DAY 180 tablet 0  . Cholecalciferol (VITAMIN D3) 2000 units TABS Take 1 tablet by mouth daily.    Marland Kitchen. LORazepam (ATIVAN) 1 MG tablet TAKE 0.5-1 TABLETS (0.5-1 MG TOTAL) BY MOUTH 2 (TWO) TIMES DAILY AS NEEDED FOR ANXIETY. 20 tablet 0  . Omega-3 Fatty Acids (OMEGA-3 FISH OIL) 1200 MG CAPS Take 1 capsule by mouth daily.    Marland Kitchen. OVER THE  COUNTER MEDICATION Take 2 capsules by mouth 2 (two) times daily. Test X180 Alpha     No facility-administered medications prior to visit.      Per HPI unless specifically indicated in ROS section below Review of Systems Objective:    BP 124/72 (BP Location: Left Arm, Patient Position: Sitting, Cuff Size: Normal)   Pulse 96   Temp 98.7 F (37.1 C) (Temporal)   Ht 5\' 5"  (1.651 m)   Wt 177 lb 5 oz (80.4 kg)   SpO2 97%   BMI 29.51 kg/m   Wt Readings from Last 3 Encounters:  05/16/19 177 lb 5 oz (80.4 kg)  06/19/18 161 lb 12 oz (73.4 kg)  05/18/18 161 lb 4 oz (73.1 kg)    Physical Exam Vitals signs and nursing note reviewed.  Constitutional:      Appearance: Normal appearance. He is not ill-appearing.  Musculoskeletal:        General: No swelling.     Right lower leg: No edema.     Left lower leg: No edema.     Comments:  R wrist WNL L wrist - FROM without pain, neg finkelstein test, no pain at scaphoid or wrist.  Discomfort with palpation of CMC as well as tender firm lump along dorsal CMC joint.  Neg SLR bilaterally. No pain with int/ext rotation at hip. Neg FABER. No pain at SIJ, or sciatic notch bilaterally.  Tender to palpation of L GTB  Skin:    General: Skin is warm and dry.     Findings: No erythema or rash.  Neurological:     Mental Status: He is alert.  Psychiatric:        Mood and Affect: Mood normal.        Assessment & Plan:   Problem List Items Addressed This Visit    Pain of left thumb - Primary    Anticipate inflamed ganglion cyst of L CMC joint. rec ice/heating pad, NSAID.  Check xrays today.  If not improving with this, rec SM eval to consider steroid injection.       Relevant Orders   DG Hand Complete Left   Bursitis of left hip    Story/exam consistent with this. Supportive care reviewed - provided with exercises from SM pt advisor, rec ibuprofen 400-600mg  TID with meals for 5-7 days, rec ice or heating pad. If not improving with this, rec  SM eval to consider steroid injection.           No orders of the defined types were placed in this encounter.  Orders Placed This Encounter  Procedures  . DG Hand Complete Left    Standing Status:   Future    Number of Occurrences:   1    Standing Expiration Date:   07/15/2020    Order Specific Question:   Reason for Exam (SYMPTOM  OR DIAGNOSIS REQUIRED)    Answer:   L 1st CMC pain    Order Specific Question:   Preferred imaging location?    Answer:   [facility]    Order Specific Question:   Radiology Contrast Protocol - do NOT remove file path    Answer:   [email or web address]    Follow up plan: Return if symptoms worsen or fail to improve.  Ria Bush, MD

## 2019-05-16 NOTE — Assessment & Plan Note (Signed)
Anticipate inflamed ganglion cyst of L CMC joint. rec ice/heating pad, NSAID.  Check xrays today.  If not improving with this, rec SM eval to consider steroid injection.

## 2019-05-16 NOTE — Assessment & Plan Note (Signed)
Story/exam consistent with this. Supportive care reviewed - provided with exercises from SM pt advisor, rec ibuprofen 400-600mg  TID with meals for 5-7 days, rec ice or heating pad. If not improving with this, rec SM eval to consider steroid injection.

## 2019-05-16 NOTE — Patient Instructions (Signed)
I think you have left hip bursitis - treat with exercises provided today and ibuprofen 400-600mg  twice a day with meals for the next 5-7 days. If not improving with this, call back to schedule appointment with Dr Lorelei Pont our sports medicine doctor.  L hand xray today to evaluate for arthritis of 1st thumb joint - I think you have ganglion cyst that is causing inflammation and pain.

## 2019-05-17 ENCOUNTER — Other Ambulatory Visit: Payer: Self-pay | Admitting: Family Medicine

## 2019-05-21 ENCOUNTER — Other Ambulatory Visit: Payer: Self-pay | Admitting: Family Medicine

## 2019-05-21 NOTE — Telephone Encounter (Signed)
Name of Medication: Lorazepam Name of Pharmacy: [facility] Last Fill or Written Date and Quantity: 3/147/20, #20 Last Office Visit and Type: 05/16/19, acute Next Office Visit and Type: none Last Controlled Substance Agreement Date: none Last UDS: none

## 2019-05-23 NOTE — Telephone Encounter (Signed)
Eprescribed.

## 2019-06-17 ENCOUNTER — Telehealth: Payer: Self-pay | Admitting: *Deleted

## 2019-06-17 NOTE — Telephone Encounter (Signed)
Agree with this - needs urgent eval to ensure something isn't stuck in throat.  plz call tomorrow for an update.

## 2019-06-17 NOTE — Telephone Encounter (Signed)
Patient's fiance called stating that they were eating nachos Friday night and he got choked. Amy stated that since yesterday patient is having difficulty swallowing and can not eat any solid food. . . Amy stated that patient states that he feels like he has something stuck in his throat.  After speaking with Dr. Damita Dunnings (Dr. Darnell Level was on a phone visit) Amy was advised that patient needs to go to the ER because he may still have something stuck in his throat and may need to have some scans done.  Amy stated that patient is at work, but she will call him and advise him of the recommendation and they will probably go  to [facility] to be checked out after he gets off from work.

## 2019-06-18 NOTE — Telephone Encounter (Signed)
Patient's Fiance Amy called back and stated they patient did not want to go to the ED for throat to be looked at.  She stated she would like further advice from you, Amy wanted to see if he could be seen in our office to be referred to specialist.   C/B # [phone]

## 2019-06-18 NOTE — Telephone Encounter (Signed)
Noted  

## 2019-06-18 NOTE — Telephone Encounter (Signed)
I was unable to reach Amy by phone and left v/m for her to call office for appt. I was unable to reach pt by phone and left v/m for pt to call office for appt.

## 2019-06-18 NOTE — Telephone Encounter (Signed)
[facility] - Client Nonclinical Telephone Record Access[facility]urse Client [facility] - Client Client Site [place] Physician Ria Bush - MD Contact Type Call Who Is Calling Patient / Member / Family / Caregiver Caller Name [place] Phone Number [phone] Patient Name Jonathon Gordon Patient DOB 1966-04-04 Call Type Message Only Information Provided Reason for Call Request for General Office Information Initial Comment Caller states she would like to leave a message. She was told to go to the ER by the office but the PT would like to wait until tomorrow. Call Closed By: Claudette Laws Transaction Date/Time: 06/17/2019 5:24:43 PM (ET)

## 2019-06-18 NOTE — Telephone Encounter (Signed)
plz schedule in office visit for tomorrow.

## 2019-06-19 ENCOUNTER — Encounter: Payer: Self-pay | Admitting: Family Medicine

## 2019-06-19 ENCOUNTER — Ambulatory Visit: Payer: BC Managed Care – PPO | Admitting: Family Medicine

## 2019-06-19 ENCOUNTER — Other Ambulatory Visit: Payer: Self-pay

## 2019-06-19 VITALS — BP 158/100 | HR 92 | Temp 99.2°F | Ht 65.0 in | Wt 174.2 lb

## 2019-06-19 DIAGNOSIS — I10 Essential (primary) hypertension: Secondary | ICD-10-CM | POA: Diagnosis not present

## 2019-06-19 DIAGNOSIS — R1310 Dysphagia, unspecified: Secondary | ICD-10-CM | POA: Diagnosis not present

## 2019-06-19 NOTE — Patient Instructions (Signed)
Keep a closer eye on blood pressure. I think elevated levels recently due to high sodium foods and stress levels. Continue amlodipine 5mg  - and ensure blood pressure getting back down to normal <140/90. If staying high over 2-3 weeks, let me know.  I think throat symptoms was from vocal cord irritation. This should continue to improve over time. Voice rest and salt water gargles can be helpful. Let me know if new lumps in neck or recurrent trouble.

## 2019-06-19 NOTE — Telephone Encounter (Signed)
Spoke with pt's fiance, Amy (on dpr), scheduling OV today at 1:00.

## 2019-06-19 NOTE — Progress Notes (Signed)
This visit was conducted in person.  BP (!) 158/100 (BP Location: Right Arm, Cuff Size: Normal)   Pulse 92   Temp 99.2 F (37.3 C) (Temporal)   Ht 5\' 5"  (1.651 m)   Wt 174 lb 3 oz (79 kg)   SpO2 96%   BMI 28.99 kg/m    CC: throat pain Subjective:    Patient ID: Jonathon Gordon, male    DOB: 1966/05/27, 53 y.o.   MRN: [id number]  HPI: Jonathon Gordon is a 53 y.o. male presenting on 06/19/2019 for Dysphagia (C/o trouble swallowing.  Started on 06/15/19. Has improved. Denies any pain. )   See recent phone note for details.  Increased stress at work.  Over weekend increased alcohol due to increased family stressors (son asking for money, drug issues, etc). He did have shouting match with son. Next morning felt dysphagia/choking sensation when swallowing, hoarser voice. Liquids were easier, more difficulty with solids. Never odynophagia, ST, nasal congestion.   Treated by drinking cold water, soups to eat. Also tried ibuprofen with benefit. He is seeing improvement each day.   Today endorses no injury with nacho to start symptoms.   Compliant with amlodipine 5mg  daily. Home BP readings doing well.   Ex smoker.  No smokeless tobacco.  Trying to decrease alcohol intake.      Relevant past medical, surgical, family and social history reviewed and updated as indicated. Interim medical history since our last visit reviewed. Allergies and medications reviewed and updated. Outpatient Medications Prior to Visit  Medication Sig Dispense Refill  . amLODipine (NORVASC) 5 MG tablet Take 1 tablet (5 mg total) by mouth daily. 90 tablet 3  . busPIRone (BUSPAR) 10 MG tablet TAKE 1 TABLET BY MOUTH TWICE A DAY 180 tablet 0  . Cholecalciferol (VITAMIN D3) 2000 units TABS Take 1 tablet by mouth daily.    Marland Kitchen LORazepam (ATIVAN) 1 MG tablet TAKE 0.5-1 TABLETS (0.5-1 MG TOTAL) BY MOUTH 2 (TWO) TIMES DAILY AS NEEDED FOR ANXIETY. 20 tablet 0  . Omega-3 Fatty Acids (OMEGA-3 FISH OIL) 1200 MG CAPS Take 1  capsule by mouth daily.    Marland Kitchen OVER THE COUNTER MEDICATION Take 2 capsules by mouth 2 (two) times daily. Test X180 Alpha     No facility-administered medications prior to visit.      Per HPI unless specifically indicated in ROS section below Review of Systems Objective:    BP (!) 158/100 (BP Location: Right Arm, Cuff Size: Normal)   Pulse 92   Temp 99.2 F (37.3 C) (Temporal)   Ht 5\' 5"  (1.651 m)   Wt 174 lb 3 oz (79 kg)   SpO2 96%   BMI 28.99 kg/m   Wt Readings from Last 3 Encounters:  06/19/19 174 lb 3 oz (79 kg)  05/16/19 177 lb 5 oz (80.4 kg)  06/19/18 161 lb 12 oz (73.4 kg)    Physical Exam Vitals signs and nursing note reviewed.  Constitutional:      General: He is not in acute distress.    Appearance: Normal appearance. He is not ill-appearing.  HENT:     Head: Normocephalic and atraumatic.     Nose: Nose normal.     Mouth/Throat:     Mouth: Mucous membranes are moist.     Tongue: No lesions.     Palate: No mass.     Pharynx: Oropharynx is clear. Uvula midline. No oropharyngeal exudate or posterior oropharyngeal erythema.  Eyes:     Extraocular Movements:  Extraocular movements intact.     Pupils: Pupils are equal, round, and reactive to light.  Neck:     Musculoskeletal: Normal range of motion and neck supple.     Thyroid: No thyroid mass or thyromegaly.  Cardiovascular:     Rate and Rhythm: Normal rate and regular rhythm.     Pulses: Normal pulses.     Heart sounds: Normal heart sounds. No murmur.  Pulmonary:     Effort: Pulmonary effort is normal. No respiratory distress.     Breath sounds: Normal breath sounds. No wheezing, rhonchi or rales.  Lymphadenopathy:     Cervical: No cervical adenopathy.  Neurological:     Mental Status: He is alert.       Assessment & Plan:   Problem List Items Addressed This Visit    Hypertension, essential    Elevated BP reading noted today - in setting of likely increased sodium intake (canned soups) and increased work  and home stressors (see HPI). Advised start monitoring more closely at home, let me know if consistently elevated to titrate amlodipine accordingly.       Dysphagia - Primary    Clarifies no sore throat or odynophagia or history of trauma to throat - rather had hoarse voice with some dysphagia to solids after shouting match with son, now improving. Reassuring exam. Encouraged decreased alcohol use. Will continue to monitor symptoms and let us know if not improving as expected.           No orders of the defined types were placed in this encounter.  No orders of the defined types were placed in this encounter.   Follow up plan: No follow-ups on file.  Eustaquio BoydenJavier Feleshia Zundel, MD

## 2019-06-19 NOTE — Assessment & Plan Note (Signed)
Clarifies no sore throat or odynophagia or history of trauma to throat - rather had hoarse voice with some dysphagia to solids after shouting match with son, now improving. Reassuring exam. Encouraged decreased alcohol use. Will continue to monitor symptoms and let us know if not improving as expected.

## 2019-06-19 NOTE — Assessment & Plan Note (Signed)
Elevated BP reading noted today - in setting of likely increased sodium intake (canned soups) and increased work and home stressors (see HPI). Advised start monitoring more closely at home, let me know if consistently elevated to titrate amlodipine accordingly.

## 2019-07-19 ENCOUNTER — Other Ambulatory Visit: Payer: Self-pay | Admitting: Family Medicine

## 2019-07-19 NOTE — Telephone Encounter (Signed)
Eprescribed.

## 2019-08-02 ENCOUNTER — Other Ambulatory Visit: Payer: Self-pay | Admitting: Family Medicine

## 2019-08-02 NOTE — Telephone Encounter (Signed)
E-scribed refill.  Pls schedule CPE and labs. 

## 2019-08-08 NOTE — Telephone Encounter (Signed)
lvm for pt to call us back and schedule cpe/labs

## 2019-09-02 ENCOUNTER — Encounter: Payer: Self-pay | Admitting: Family Medicine

## 2019-09-02 ENCOUNTER — Other Ambulatory Visit: Payer: Self-pay | Admitting: Family Medicine

## 2019-09-02 NOTE — Telephone Encounter (Signed)
[place] called patient in September and left message to call back to schedule CPE and labs. Have not heard back. Sending message to pharmacy declining medication until appointment is set up.

## 2019-09-03 MED ORDER — BUSPIRONE HCL 10 MG PO TABS: 10.0000 mg | ORAL_TABLET | Freq: Two times a day (BID) | ORAL | 0 refills | Status: DC

## 2019-09-20 ENCOUNTER — Encounter: Payer: Self-pay | Admitting: Family Medicine

## 2019-09-20 NOTE — Telephone Encounter (Signed)
2nd attempt-mailed letter 09/20/19

## 2019-09-26 ENCOUNTER — Other Ambulatory Visit: Payer: Self-pay | Admitting: *Deleted

## 2019-09-26 NOTE — Telephone Encounter (Signed)
Pt fiance is calling to request the patient's Ativan 1mg  be refilled.  Pt is scheduled 10/28/2019 with Dr Darnell Level  [facility]  Please advise, thanks.

## 2019-09-26 NOTE — Telephone Encounter (Signed)
Last office visit 06/19/2019 for dysphagia and HTN.  Last refilled 07/19/2019 for #20 with no refills.  CPE scheduled for 10/28/2019.

## 2019-09-27 MED ORDER — LORAZEPAM 1 MG PO TABS: 0.5000 mg | ORAL_TABLET | Freq: Two times a day (BID) | ORAL | 0 refills | Status: DC | PRN

## 2019-09-27 NOTE — Telephone Encounter (Signed)
ERx 

## 2019-10-10 ENCOUNTER — Telehealth: Payer: Self-pay

## 2019-10-10 NOTE — Telephone Encounter (Signed)
LVM w COVID screen, front door and back lab in fo 11.19.2020 TLJ 

## 2019-10-19 ENCOUNTER — Other Ambulatory Visit: Payer: Self-pay | Admitting: Family Medicine

## 2019-10-19 DIAGNOSIS — R739 Hyperglycemia, unspecified: Secondary | ICD-10-CM

## 2019-10-19 DIAGNOSIS — Z1159 Encounter for screening for other viral diseases: Secondary | ICD-10-CM

## 2019-10-19 DIAGNOSIS — I10 Essential (primary) hypertension: Secondary | ICD-10-CM

## 2019-10-19 DIAGNOSIS — Z125 Encounter for screening for malignant neoplasm of prostate: Secondary | ICD-10-CM

## 2019-10-21 ENCOUNTER — Other Ambulatory Visit (INDEPENDENT_AMBULATORY_CARE_PROVIDER_SITE_OTHER): Payer: BC Managed Care – PPO

## 2019-10-21 ENCOUNTER — Other Ambulatory Visit: Payer: Self-pay

## 2019-10-21 DIAGNOSIS — Z1159 Encounter for screening for other viral diseases: Secondary | ICD-10-CM | POA: Diagnosis not present

## 2019-10-21 DIAGNOSIS — I10 Essential (primary) hypertension: Secondary | ICD-10-CM | POA: Diagnosis not present

## 2019-10-21 DIAGNOSIS — Z125 Encounter for screening for malignant neoplasm of prostate: Secondary | ICD-10-CM

## 2019-10-21 DIAGNOSIS — R739 Hyperglycemia, unspecified: Secondary | ICD-10-CM

## 2019-10-21 LAB — COMPREHENSIVE METABOLIC PANEL
ALT: 31 U/L (ref 0–53)
AST: 23 U/L (ref 0–37)
Albumin: 4.4 g/dL (ref 3.5–5.2)
Alkaline Phosphatase: 77 U/L (ref 39–117)
BUN: 36 mg/dL — ABNORMAL HIGH (ref 6–23)
CO2: 25 mEq/L (ref 19–32)
Calcium: 9.4 mg/dL (ref 8.4–10.5)
Chloride: 101 mEq/L (ref 96–112)
Creatinine, Ser: 1.35 mg/dL (ref 0.40–1.50)
GFR: 54.78 mL/min — ABNORMAL LOW (ref >60.00)
Glucose, Bld: 118 mg/dL — ABNORMAL HIGH (ref 70–99)
Potassium: 3.8 mEq/L (ref 3.5–5.1)
Sodium: 136 mEq/L (ref 135–145)
Total Bilirubin: 0.4 mg/dL (ref 0.2–1.2)
Total Protein: 7.3 g/dL (ref 6.0–8.3)

## 2019-10-21 LAB — MICROALBUMIN / CREATININE URINE RATIO
Creatinine,U: 74 mg/dL
Microalb Creat Ratio: 1 mg/g (ref 0.0–30.0)
Microalb, Ur: 0.8 mg/dL (ref 0.0–1.9)

## 2019-10-21 LAB — LIPID PANEL
Cholesterol: 226 mg/dL — ABNORMAL HIGH (ref 0–200)
HDL: 76.4 mg/dL (ref >39.00)
LDL Cholesterol: 127 mg/dL — ABNORMAL HIGH (ref 0–99)
NonHDL: 149.71
Total CHOL/HDL Ratio: 3
Triglycerides: 113 mg/dL (ref 0.0–149.0)
VLDL: 22.6 mg/dL (ref 0.0–40.0)

## 2019-10-21 LAB — PSA: PSA: 0.31 ng/mL (ref 0.10–4.00)

## 2019-10-21 LAB — HEMOGLOBIN A1C: Hgb A1c MFr Bld: 5.4 % (ref 4.6–6.5)

## 2019-10-22 LAB — HEPATITIS C ANTIBODY
Hepatitis C Ab: NONREACTIVE
SIGNAL TO CUT-OFF: 0.02 (ref <1.00)

## 2019-10-28 ENCOUNTER — Ambulatory Visit (INDEPENDENT_AMBULATORY_CARE_PROVIDER_SITE_OTHER): Payer: BC Managed Care – PPO | Admitting: Family Medicine

## 2019-10-28 ENCOUNTER — Other Ambulatory Visit: Payer: Self-pay

## 2019-10-28 ENCOUNTER — Encounter: Payer: Self-pay | Admitting: Family Medicine

## 2019-10-28 VITALS — BP 160/96 | HR 101 | Temp 98.4°F | Ht 65.0 in | Wt 186.5 lb

## 2019-10-28 DIAGNOSIS — Z0000 Encounter for general adult medical examination without abnormal findings: Secondary | ICD-10-CM

## 2019-10-28 DIAGNOSIS — F411 Generalized anxiety disorder: Secondary | ICD-10-CM

## 2019-10-28 DIAGNOSIS — I10 Essential (primary) hypertension: Secondary | ICD-10-CM

## 2019-10-28 DIAGNOSIS — R600 Localized edema: Secondary | ICD-10-CM

## 2019-10-28 DIAGNOSIS — Z1211 Encounter for screening for malignant neoplasm of colon: Secondary | ICD-10-CM

## 2019-10-28 DIAGNOSIS — R739 Hyperglycemia, unspecified: Secondary | ICD-10-CM | POA: Diagnosis not present

## 2019-10-28 MED ORDER — BUSPIRONE HCL 10 MG PO TABS: 15.0000 mg | ORAL_TABLET | Freq: Two times a day (BID) | ORAL | 0 refills | Status: DC

## 2019-10-28 MED ORDER — LISINOPRIL 20 MG PO TABS: 20.0000 mg | ORAL_TABLET | Freq: Every day | ORAL | 3 refills | Status: DC

## 2019-10-28 MED ORDER — LORAZEPAM 1 MG PO TABS: 0.5000 mg | ORAL_TABLET | Freq: Two times a day (BID) | ORAL | 0 refills | Status: DC | PRN

## 2019-10-28 NOTE — Assessment & Plan Note (Signed)
Recently worsening R>L anticipate amlodipine contributing so will stop. See above. Reviewed low sodium/salt diet. Recommend increased water intake and leg elevation. Reassess off CCB.

## 2019-10-28 NOTE — Assessment & Plan Note (Signed)
A1c stable

## 2019-10-28 NOTE — Assessment & Plan Note (Signed)
Deteriorated. Amlodipine may be worsening pedal edema. Stop amlodipine, trial lisinopril 20mg  daily. He will return in 1 wk for Cr check. RTC 6 wks BP f/u visit.

## 2019-10-28 NOTE — Progress Notes (Addendum)
This visit was conducted in person.  BP (!) 160/96 (BP Location: Right Arm, Patient Position: Sitting, Cuff Size: Normal)   Pulse (!) 101   Temp 98.4 F (36.9 C) (Temporal)   Ht 5\' 5"  (1.651 m)   Wt 186 lb 8 oz (84.6 kg)   SpO2 97%   BMI 31.04 kg/m   BP Readings from Last 3 Encounters:  10/28/19 (!) 160/96  06/19/19 (!) 158/100  05/16/19 124/72   elevated on repeat  CC: CPE Subjective:    Patient ID: Jonathon Gordon, male    DOB: November 23, 1965, 53 y.o.   MRN: [id number]  HPI: Jonathon Gordon is a 53 y.o. male presenting on 10/28/2019 for Annual Exam   BP elevated today - he came straight from work, always stressful. Has not been working out.   Planning to get [place] this month - [place].   Anxiety - ongoing work stressors.   Preventative: Colon cancer screening - discussed, would like colonoscopy  Prostate cancer screening - discussed. Continue yearly PSA.  Flu shot - yearly Tdap 2019 Seat belt use discussed Sunscreen use discussed. No changing moles on skin. 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 Interim medical history since our last visit reviewed. Allergies and medications reviewed and updated. Outpatient Medications Prior to Visit  Medication Sig Dispense Refill  . Multiple Vitamins-Minerals (ONE-A-DAY MENS 50+ ADVANTAGE PO) Take 1 tablet by mouth daily.    . Omega-3 Fatty Acids (OMEGA-3 FISH OIL) 1200 MG CAPS Take 1 capsule by mouth daily.    Marland Kitchen. amLODipine (NORVASC) 5 MG tablet TAKE 1 TABLET BY MOUTH EVERY DAY 90 tablet 0  . busPIRone (BUSPAR)  10 MG tablet Take 1 tablet (10 mg total) by mouth 2 (two) times daily. 180 tablet 0  . LORazepam (ATIVAN) 1 MG tablet Take 0.5-1 tablets (0.5-1 mg total) by mouth 2 (two) times daily as needed for anxiety. 20 tablet 0  . Cholecalciferol (VITAMIN D3) 2000 units TABS Take 1 tablet by mouth daily.    Marland Kitchen. OVER THE COUNTER MEDICATION Take 2 capsules by mouth 2 (two) times daily. Test X180 Alpha     No facility-administered medications prior to visit.      Per HPI unless specifically indicated in ROS section below Review of Systems  Constitutional: Negative for activity change, appetite change, chills, fatigue, fever and unexpected weight change.  HENT: Negative for hearing loss.   Eyes: Negative for visual disturbance.  Respiratory: Negative for cough, chest tightness, shortness of breath and wheezing.   Cardiovascular: Positive for leg swelling. Negative for chest pain and palpitations.  Gastrointestinal: Negative for abdominal distention, abdominal pain, blood in stool, constipation, diarrhea, nausea and vomiting.  Genitourinary: Negative for difficulty urinating and hematuria.  Musculoskeletal: Negative for arthralgias, myalgias and neck pain.  Skin: Negative for rash.  Neurological: Negative for dizziness, seizures, syncope and headaches.  Hematological: Negative for adenopathy. Does not bruise/bleed easily.  Psychiatric/Behavioral: Negative for dysphoric mood. The patient is nervous/anxious.    Objective:    BP (!) 160/96 (BP Location: Right Arm, Patient  Position: Sitting, Cuff Size: Normal)   Pulse (!) 101   Temp 98.4 F (36.9 C) (Temporal)   Ht 5\' 5"  (1.651 m)   Wt 186 lb 8 oz (84.6 kg)   SpO2 97%   BMI 31.04 kg/m   Wt Readings from Last 3 Encounters:  10/28/19 186 lb 8 oz (84.6 kg)  06/19/19 174 lb 3 oz (79 kg)  05/16/19 177 lb 5 oz (80.4 kg)    Physical Exam Vitals signs and nursing note reviewed.  Constitutional:      General: He is not in acute distress.    Appearance:  Normal appearance. He is well-developed. He is not ill-appearing.  HENT:     Head: Normocephalic and atraumatic.     Right Ear: Hearing, tympanic membrane, ear canal and external ear normal.     Left Ear: Hearing, tympanic membrane, ear canal and external ear normal.     Mouth/Throat:     Pharynx: Uvula midline.  Eyes:     General: No scleral icterus.    Extraocular Movements: Extraocular movements intact.     Conjunctiva/sclera: Conjunctivae normal.     Pupils: Pupils are equal, round, and reactive to light.  Neck:     Musculoskeletal: Normal range of motion and neck supple.  Cardiovascular:     Rate and Rhythm: Normal rate and regular rhythm.     Pulses: Normal pulses.          Radial pulses are 2+ on the right side and 2+ on the left side.     Heart sounds: Normal heart sounds. No murmur.  Pulmonary:     Effort: Pulmonary effort is normal. No respiratory distress.     Breath sounds: Normal breath sounds. No wheezing, rhonchi or rales.  Abdominal:     General: Abdomen is flat. Bowel sounds are normal. There is no distension.     Palpations: Abdomen is soft. There is no mass.     Tenderness: There is no abdominal tenderness. There is no guarding or rebound.     Hernia: No hernia is present.  Musculoskeletal: Normal range of motion.     Right lower leg: Edema (1+ pitting edema, marked nonpitting swelling) present.     Left lower leg: Edema (1+) present.  Lymphadenopathy:     Cervical: No cervical adenopathy.  Skin:    General: Skin is warm and dry.     Findings: No erythema or rash.  Neurological:     General: No focal deficit present.     Mental Status: He is alert and oriented to person, place, and time.     Comments: CN grossly intact, station and gait intact  Psychiatric:        Mood and Affect: Mood normal.        Behavior: Behavior normal.        Thought Content: Thought content normal.        Judgment: Judgment normal.       Results for orders placed or performed  in visit on 10/21/19  Microalbumin / creatinine urine ratio  Result Value Ref Range   Microalb, Ur 0.8 0.0 - 1.9 mg/dL   Creatinine,U 74.0 mg/dL   Microalb Creat Ratio 1.0 0.0 - 30.0 mg/g  Hepatitis C Antibody  Result Value Ref Range   Hepatitis C Ab NON-REACTIVE NON-REACTI   SIGNAL TO CUT-OFF 0.02 <1.00  PSA  Result Value Ref Range   PSA 0.31 0.10 - 4.00 ng/mL  Hemoglobin A1c  Result Value Ref Range  Hgb A1c MFr Bld 5.4 4.6 - 6.5 %  Comprehensive metabolic panel  Result Value Ref Range   Sodium 136 135 - 145 mEq/L   Potassium 3.8 3.5 - 5.1 mEq/L   Chloride 101 96 - 112 mEq/L   CO2 25 19 - 32 mEq/L   Glucose, Bld 118 (H) 70 - 99 mg/dL   BUN 36 (H) 6 - 23 mg/dL   Creatinine, Ser 9.56 0.40 - 1.50 mg/dL   Total Bilirubin 0.4 0.2 - 1.2 mg/dL   Alkaline Phosphatase 77 39 - 117 U/L   AST 23 0 - 37 U/L   ALT 31 0 - 53 U/L   Total Protein 7.3 6.0 - 8.3 g/dL   Albumin 4.4 3.5 - 5.2 g/dL   GFR 21.30 (L) >86.57 mL/min   Calcium 9.4 8.4 - 10.5 mg/dL  Lipid panel  Result Value Ref Range   Cholesterol 226 (H) 0 - 200 mg/dL   Triglycerides 846.9 0.0 - 149.0 mg/dL   HDL 62.95 >28.41 mg/dL   VLDL 32.4 0.0 - 40.1 mg/dL   LDL Cholesterol 027 (H) 0 - 99 mg/dL   Total CHOL/HDL Ratio 3    NonHDL 149.71    GAD 7 : Generalized Anxiety Score 10/28/2019 05/18/2018  Nervous, Anxious, on Edge 3 2  Control/stop worrying 3 2  Worry too much - different things 3 2  Trouble relaxing 3 2  Restless 2 1  Easily annoyed or irritable 2 2  Afraid - awful might happen 3 3  Total GAD 7 Score 19 14   Assessment & Plan:  This visit occurred during the SARS-CoV-2 public health emergency.  Safety protocols were in place, including screening questions prior to the visit, additional usage of staff PPE, and extensive cleaning of exam room while observing appropriate contact time as indicated for disinfecting solutions.   Problem List Items Addressed This Visit    Pedal edema    Recently worsening R>L  anticipate amlodipine contributing so will stop. See above. Reviewed low sodium/salt diet. Recommend increased water intake and leg elevation. Reassess off CCB.      Hypertension, essential    Deteriorated. Amlodipine may be worsening pedal edema. Stop amlodipine, trial lisinopril 20mg  daily. He will return in 1 wk for Cr check. RTC 6 wks BP f/u visit.       Relevant Medications   lisinopril (ZESTRIL) 20 MG tablet   Other Relevant Orders   Basic metabolic panel   Hyperglycemia    A1c stable.       Health maintenance examination - Primary    Preventative protocols reviewed and updated unless pt declined. Discussed healthy diet and lifestyle.       GAD (generalized anxiety disorder)    Increasing work stressors. Deteriorated anxiety based on worse GAD7. Will increase buspar to 15mg  bid. Continue lorazepam at this time. Update on effect. Pt agrees with plan.       Relevant Medications   LORazepam (ATIVAN) 1 MG tablet   busPIRone (BUSPAR) 10 MG tablet    Other Visit Diagnoses    Special screening for malignant neoplasms, colon       Relevant Orders   Ambulatory referral to Gastroenterology       Meds ordered this encounter  Medications  . LORazepam (ATIVAN) 1 MG tablet    Sig: Take 0.5-1 tablets (0.5-1 mg total) by mouth 2 (two) times daily as needed for anxiety.    Dispense:  30 tablet    Refill:  0  Not to exceed 5 additional fills before 11/19/2019  . busPIRone (BUSPAR) 10 MG tablet    Sig: Take 1.5 tablets (15 mg total) by mouth 2 (two) times daily.    Dispense:  180 tablet    Refill:  0  . lisinopril (ZESTRIL) 20 MG tablet    Sig: Take 1 tablet (20 mg total) by mouth daily.    Dispense:  90 tablet    Refill:  3   Orders Placed This Encounter  Procedures  . Basic metabolic panel    Standing Status:   Future    Standing Expiration Date:   10/27/2020  . Ambulatory referral to Gastroenterology    Referral Priority:   Routine    Referral Type:   Consultation     Referral Reason:   Specialty Services Required    Number of Visits Requested:   1    Patient instructions: BP staying too high Start lisinopril  daily in place of amlodipine. Return in 1 week for lab visit to recheck kidney function. Increase water intake. Increase buspar to  twice daily.  Return in 6 week for BP check.   Follow up plan: Return in about 6 weeks (around 12/09/2019) for follow up visit.  Eustaquio Boyden, MD

## 2019-10-28 NOTE — Assessment & Plan Note (Signed)
Preventative protocols reviewed and updated unless pt declined. Discussed healthy diet and lifestyle.  

## 2019-10-28 NOTE — Patient Instructions (Signed)
BP staying too high Start lisinopril 20mg  daily in place of amlodipine. Return in 1 week for lab visit to recheck kidney function. Increase water intake. Increase buspar to 15mg  twice daily.  Return in 6 week for BP check.   Health Maintenance, Male Adopting a healthy lifestyle and getting preventive care are important in promoting health and wellness. Ask your health care provider about:  The right schedule for you to have regular tests and exams.  Things you can do on your own to prevent diseases and keep yourself healthy. What should I know about diet, weight, and exercise? Eat a healthy diet   Eat a diet that includes plenty of vegetables, fruits, low-fat dairy products, and lean protein.  Do not eat a lot of foods that are high in solid fats, added sugars, or sodium. Maintain a healthy weight Body mass index (BMI) is a measurement that can be used to identify possible weight problems. It estimates body fat based on height and weight. Your health care provider can help determine your BMI and help you achieve or maintain a healthy weight. Get regular exercise Get regular exercise. This is one of the most important things you can do for your health. Most adults should:  Exercise for at least 150 minutes each week. The exercise should increase your heart rate and make you sweat (moderate-intensity exercise).  Do strengthening exercises at least twice a week. This is in addition to the moderate-intensity exercise.  Spend less time sitting. Even light physical activity can be beneficial. Watch cholesterol and blood lipids Have your blood tested for lipids and cholesterol at 55 years of age, then have this test every 5 years. You may need to have your cholesterol levels checked more often if:  Your lipid or cholesterol levels are high.  You are older than 55 years of age.  You are at high risk for heart disease. What should I know about cancer screening? Many types of cancers can  be detected early and may often be prevented. Depending on your health history and family history, you may need to have cancer screening at various ages. This may include screening for:  Colorectal cancer.  Prostate cancer.  Skin cancer.  Lung cancer. What should I know about heart disease, diabetes, and high blood pressure? Blood pressure and heart disease  High blood pressure causes heart disease and increases the risk of stroke. This is more likely to develop in people who have high blood pressure readings, are of African descent, or are overweight.  Talk with your health care provider about your target blood pressure readings.  Have your blood pressure checked: ? Every 3-5 years if you are 55-65 years of age. ? Every year if you are 55 years old or older.  If you are between the ages of 35 and 69 and are a current or former smoker, ask your health care provider if you should have a one-time screening for abdominal aortic aneurysm (AAA). Diabetes Have regular diabetes screenings. This checks your fasting blood sugar level. Have the screening done:  Once every three years after age 55 if you are at a normal weight and have a low risk for diabetes.  More often and at a younger age if you are overweight or have a high risk for diabetes. What should I know about preventing infection? Hepatitis B If you have a higher risk for hepatitis B, you should be screened for this virus. Talk with your health care provider to find out if  you are at risk for hepatitis B infection. Hepatitis C Blood testing is recommended for:  Everyone born from [birth year] through 1967.  Anyone with known risk factors for hepatitis C. Sexually transmitted infections (STIs)  You should be screened each year for STIs, including gonorrhea and chlamydia, if: ? You are sexually active and are younger than 55 years of age. ? You are older than 55 years of age and your health care provider tells you that you are at risk  for this type of infection. ? Your sexual activity has changed since you were last screened, and you are at increased risk for chlamydia or gonorrhea. Ask your health care provider if you are at risk.  Ask your health care provider about whether you are at high risk for HIV. Your health care provider may recommend a prescription medicine to help prevent HIV infection. If you choose to take medicine to prevent HIV, you should first get tested for HIV. You should then be tested every 3 months for as long as you are taking the medicine. Follow these instructions at home: Lifestyle  Do not use any products that contain nicotine or tobacco, such as cigarettes, e-cigarettes, and chewing tobacco. If you need help quitting, ask your health care provider.  Do not use street drugs.  Do not share needles.  Ask your health care provider for help if you need support or information about quitting drugs. Alcohol use  Do not drink alcohol if your health care provider tells you not to drink.  If you drink alcohol: ? Limit how much you have to 0-2 drinks a day. ? Be aware of how much alcohol is in your drink. In the [place] one drink equals one 12 oz bottle of beer (355 mL), one 5 oz glass of wine (148 mL), or one 1 oz glass of hard liquor (44 mL). General instructions  Schedule regular health, dental, and eye exams.  Stay current with your vaccines.  Tell your health care provider if: ? You often feel depressed. ? You have ever been abused or do not feel safe at home. Summary  Adopting a healthy lifestyle and getting preventive care are important in promoting health and wellness.  Follow your health care provider's instructions about healthy diet, exercising, and getting tested or screened for diseases.  Follow your health care provider's instructions on monitoring your cholesterol and blood pressure. This information is not intended to replace advice given to you by your health care provider.  Make sure you discuss any questions you have with your health care provider. Document Released: 05/05/2008 Document Revised: 10/31/2018 Document Reviewed: 10/31/2018 Elsevier Patient Education  2020 [employer].

## 2019-10-28 NOTE — Assessment & Plan Note (Signed)
Increasing work stressors. Deteriorated anxiety based on worse GAD7. Will increase buspar to 15mg  bid. Continue lorazepam at this time. Update on effect. Pt agrees with plan.

## 2019-11-05 ENCOUNTER — Other Ambulatory Visit: Payer: BC Managed Care – PPO

## 2019-11-05 ENCOUNTER — Encounter: Payer: Self-pay | Admitting: *Deleted

## 2019-11-05 ENCOUNTER — Other Ambulatory Visit: Payer: Self-pay

## 2019-11-07 ENCOUNTER — Other Ambulatory Visit: Payer: Self-pay | Admitting: Family Medicine

## 2019-11-20 ENCOUNTER — Other Ambulatory Visit: Payer: BC Managed Care – PPO

## 2019-11-28 ENCOUNTER — Other Ambulatory Visit (INDEPENDENT_AMBULATORY_CARE_PROVIDER_SITE_OTHER): Payer: Self-pay

## 2019-11-28 ENCOUNTER — Other Ambulatory Visit: Payer: Self-pay

## 2019-11-28 DIAGNOSIS — I10 Essential (primary) hypertension: Secondary | ICD-10-CM

## 2019-11-28 LAB — BASIC METABOLIC PANEL
BUN: 21 mg/dL (ref 6–23)
CO2: 27 mEq/L (ref 19–32)
Calcium: 9.2 mg/dL (ref 8.4–10.5)
Chloride: 103 mEq/L (ref 96–112)
Creatinine, Ser: 1.39 mg/dL (ref 0.40–1.50)
GFR: 52.94 mL/min — ABNORMAL LOW (ref >60.00)
Glucose, Bld: 133 mg/dL — ABNORMAL HIGH (ref 70–99)
Potassium: 4 mEq/L (ref 3.5–5.1)
Sodium: 139 mEq/L (ref 135–145)

## 2019-12-09 ENCOUNTER — Encounter: Payer: Self-pay | Admitting: Family Medicine

## 2019-12-09 ENCOUNTER — Ambulatory Visit (INDEPENDENT_AMBULATORY_CARE_PROVIDER_SITE_OTHER): Payer: BC Managed Care – PPO | Admitting: Family Medicine

## 2019-12-09 ENCOUNTER — Other Ambulatory Visit: Payer: Self-pay

## 2019-12-09 VITALS — BP 152/100 | HR 76 | Temp 97.8°F | Ht 65.0 in | Wt 183.2 lb

## 2019-12-09 DIAGNOSIS — I10 Essential (primary) hypertension: Secondary | ICD-10-CM

## 2019-12-09 DIAGNOSIS — F411 Generalized anxiety disorder: Secondary | ICD-10-CM

## 2019-12-09 MED ORDER — HYDROCHLOROTHIAZIDE 25 MG PO TABS: 25.0000 mg | ORAL_TABLET | Freq: Every day | ORAL | 6 refills | Status: DC

## 2019-12-09 NOTE — Patient Instructions (Addendum)
Blood pressure is staying elevated - add hctz (hydrochlorothiazide) 25mg  daily to your lisinopril. Return in 10 days for lab visit (non fasting is ok).  Return in 6 weeks for BP check.  Keep checking at home and let know if any concerns.

## 2019-12-09 NOTE — Assessment & Plan Note (Signed)
Doing better on buspar 15mg  bid - continue this.

## 2019-12-09 NOTE — Progress Notes (Signed)
This visit was conducted in person.  BP (!) 152/100 (BP Location: Left Arm, Patient Position: Sitting, Cuff Size: Large)   Pulse 76   Temp 97.8 F (36.6 C) (Temporal)   Ht 5\' 5"  (1.651 m)   Wt 183 lb 4 oz (83.1 kg)   SpO2 97%   BMI 30.49 kg/m   Elevated on repeat   CC: 6 wk f/u visit Subjective:    Patient ID: Jonathon Gordon, male    DOB: Dec 10, 1965, 54 y.o.   MRN: [id number]  HPI: Jonathon Gordon is a 54 y.o. male presenting on 12/09/2019 for Hypertension (Here for 6 wk f/u.)   Got married since last seen here! His mother was hospitalized with covid, now back home and recovering. Lots of family with this, as well as sick co workers.  See prior note for details. Last visit we stopped amlodipine due to concerns over worsening pedal edema. We started lisinopril 20mg  daily.   HTN - Compliant with current antihypertensive regimen of lisinopril 20mg  daily.  Does occasional check blood pressures at home: 140/90s. No low blood pressure readings or symptoms of dizziness/syncope. Denies HA, vision changes, CP/tightness, SOB, leg swelling. He is limiting salt/sodium in diet.   Anxiety - he feels increased buspar dose to 15mg  bid has helped him manage stress.      Relevant past medical, surgical, family and social history reviewed and updated as indicated. Interim medical history since our last visit reviewed. Allergies and medications reviewed and updated. Outpatient Medications Prior to Visit  Medication Sig Dispense Refill  . busPIRone (BUSPAR) 10 MG tablet Take 1.5 tablets (15 mg total) by mouth 2 (two) times daily. 180 tablet 0  . lisinopril (ZESTRIL) 20 MG tablet Take 1 tablet (20 mg total) by mouth daily. 90 tablet 3  . LORazepam (ATIVAN) 1 MG tablet Take 0.5-1 tablets (0.5-1 mg total) by mouth 2 (two) times daily as needed for anxiety. 30 tablet 0  . Multiple Vitamins-Minerals (ONE-A-DAY MENS 50+ ADVANTAGE PO) Take 1 tablet by mouth daily.    . Omega-3 Fatty Acids (OMEGA-3 FISH  OIL) 1200 MG CAPS Take 1 capsule by mouth daily.     No facility-administered medications prior to visit.     Per HPI unless specifically indicated in ROS section below Review of Systems Objective:    BP (!) 152/100 (BP Location: Left Arm, Patient Position: Sitting, Cuff Size: Large)   Pulse 76   Temp 97.8 F (36.6 C) (Temporal)   Ht 5\' 5"  (1.651 m)   Wt 183 lb 4 oz (83.1 kg)   SpO2 97%   BMI 30.49 kg/m   Wt Readings from Last 3 Encounters:  12/09/19 183 lb 4 oz (83.1 kg)  10/28/19 186 lb 8 oz (84.6 kg)  06/19/19 174 lb 3 oz (79 kg)    Physical Exam Vitals and nursing note reviewed.  Constitutional:      Appearance: Normal appearance.  Cardiovascular:     Rate and Rhythm: Normal rate and regular rhythm.     Pulses: Normal pulses.     Heart sounds: Normal heart sounds. No murmur.  Pulmonary:     Effort: Pulmonary effort is normal. No respiratory distress.     Breath sounds: Normal breath sounds. No wheezing, rhonchi or rales.  Musculoskeletal:     Right lower leg: No edema.     Left lower leg: No edema.  Neurological:     Mental Status: He is alert.  Psychiatric:  Mood and Affect: Mood normal.        Behavior: Behavior normal.       Results for orders placed or performed in visit on 74/25/95  Basic metabolic panel  Result Value Ref Range   Sodium 139 135 - 145 mEq/L   Potassium 4.0 3.5 - 5.1 mEq/L   Chloride 103 96 - 112 mEq/L   CO2 27 19 - 32 mEq/L   Glucose, Bld 133 (H) 70 - 99 mg/dL   BUN 21 6 - 23 mg/dL   Creatinine, Ser 1.39 0.40 - 1.50 mg/dL   GFR 52.94 (L) >60.00 mL/min   Calcium 9.2 8.4 - 10.5 mg/dL   Assessment & Plan:  This visit occurred during the SARS-CoV-2 public health emergency.  Safety protocols were in place, including screening questions prior to the visit, additional usage of staff PPE, and extensive cleaning of exam room while observing appropriate contact time as indicated for disinfecting solutions.   Problem List Items  Addressed This Visit    Hypertension, essential - Primary    Chronic, uncontrolled. Pedal edema with amlodipine - now off this. Will add hctz 25mg  daily to lisinopril 20mg  daily. RTC 6 wks f/u visit. Pt agrees with plan. Reviewed importance of good hydration status on this regimen.       Relevant Medications   hydrochlorothiazide (HYDRODIURIL) 25 MG tablet   Other Relevant Orders   Basic metabolic panel   GAD (generalized anxiety disorder)    Doing better on buspar 15mg  bid - continue this.           Meds ordered this encounter  Medications  . hydrochlorothiazide (HYDRODIURIL) 25 MG tablet    Sig: Take 1 tablet (25 mg total) by mouth daily.    Dispense:  30 tablet    Refill:  6   Orders Placed This Encounter  Procedures  . Basic metabolic panel    Standing Status:   Future    Standing Expiration Date:   12/08/2020    Patient Instructions  Blood pressure is staying elevated - add hctz (hydrochlorothiazide) 25mg  daily to your lisinopril. Return in 10 days for lab visit (non fasting is ok).  Return in 6 weeks for BP check.  Keep checking at home and let us know if any concerns.    Follow up plan: Return if symptoms worsen or fail to improve.  Ria Bush, MD

## 2019-12-09 NOTE — Assessment & Plan Note (Signed)
Chronic, uncontrolled. Pedal edema with amlodipine - now off this. Will add hctz 25mg  daily to lisinopril 20mg  daily. RTC 6 wks f/u visit. Pt agrees with plan. Reviewed importance of good hydration status on this regimen.

## 2019-12-17 ENCOUNTER — Telehealth: Payer: Self-pay

## 2019-12-17 NOTE — Telephone Encounter (Signed)
LVM w COVID screen, front door and back lab info 1.26.2021 TLJ 

## 2019-12-19 ENCOUNTER — Other Ambulatory Visit: Payer: Self-pay

## 2019-12-19 ENCOUNTER — Other Ambulatory Visit: Payer: Self-pay | Admitting: Family Medicine

## 2019-12-19 ENCOUNTER — Other Ambulatory Visit (INDEPENDENT_AMBULATORY_CARE_PROVIDER_SITE_OTHER): Payer: BC Managed Care – PPO

## 2019-12-19 DIAGNOSIS — I10 Essential (primary) hypertension: Secondary | ICD-10-CM | POA: Diagnosis not present

## 2019-12-19 LAB — BASIC METABOLIC PANEL
BUN: 31 mg/dL — ABNORMAL HIGH (ref 6–23)
CO2: 28 mEq/L (ref 19–32)
Calcium: 9.5 mg/dL (ref 8.4–10.5)
Chloride: 98 mEq/L (ref 96–112)
Creatinine, Ser: 1.37 mg/dL (ref 0.40–1.50)
GFR: 53.82 mL/min — ABNORMAL LOW (ref >60.00)
Glucose, Bld: 117 mg/dL — ABNORMAL HIGH (ref 70–99)
Potassium: 4.1 mEq/L (ref 3.5–5.1)
Sodium: 135 mEq/L (ref 135–145)

## 2020-01-20 ENCOUNTER — Other Ambulatory Visit: Payer: Self-pay

## 2020-01-20 ENCOUNTER — Ambulatory Visit: Payer: BC Managed Care – PPO | Admitting: Family Medicine

## 2020-01-20 ENCOUNTER — Encounter: Payer: Self-pay | Admitting: Family Medicine

## 2020-01-20 VITALS — BP 134/100 | HR 72 | Temp 97.6°F | Ht 65.0 in | Wt 185.1 lb

## 2020-01-20 DIAGNOSIS — F411 Generalized anxiety disorder: Secondary | ICD-10-CM

## 2020-01-20 DIAGNOSIS — I10 Essential (primary) hypertension: Secondary | ICD-10-CM | POA: Diagnosis not present

## 2020-01-20 MED ORDER — METOPROLOL TARTRATE 25 MG PO TABS: 25.0000 mg | ORAL_TABLET | Freq: Two times a day (BID) | ORAL | 3 refills | Status: DC

## 2020-01-20 MED ORDER — LORAZEPAM 1 MG PO TABS: 0.5000 mg | ORAL_TABLET | Freq: Two times a day (BID) | ORAL | 0 refills | Status: DC | PRN

## 2020-01-20 MED ORDER — LISINOPRIL-HYDROCHLOROTHIAZIDE 20-25 MG PO TABS: 1.0000 | ORAL_TABLET | Freq: Every day | ORAL | 3 refills | Status: DC

## 2020-01-20 NOTE — Progress Notes (Signed)
This visit was conducted in person.  BP (!) 134/100 (BP Location: Right Arm, Patient Position: Sitting, Cuff Size: Normal)   Pulse 72   Temp 97.6 F (36.4 C) (Temporal)   Ht 5\' 5"  (1.651 m)   Wt 185 lb 1 oz (83.9 kg)   SpO2 97%   BMI 30.80 kg/m   BP Readings from Last 3 Encounters:  01/20/20 (!) 134/100  12/09/19 (!) 152/100  10/28/19 (!) 160/96  BP elevated on repeat to 170/100  CC: HTN f/u visit Subjective:    Patient ID: Jonathon Gordon, male    DOB: 1965-12-24, 54 y.o.   MRN: [id number]  HPI: HERBERT MARKEN is a 54 y.o. male presenting on 01/20/2020 for Hypertension (Here for 6 wk f/u.)   See prior note for details.  HTN - Compliant with current antihypertensive regimen of lisinopril hctz 25/20mg  daily (individual components). Does check blood pressures at home: 145-160/90-95. Amlodipine previously caused ankle swelling. No low blood pressure readings or symptoms of dizziness/syncope. mild fatigue. Denies HA, vision changes, CP/tightness, SOB, leg swelling. Denies tongue/lip swelling, dry nagging cough, possible mild ED.   Anxiety - doing well with buspar 15mg  bid and lorazepam PRN.      Relevant past medical, surgical, family and social history reviewed and updated as indicated. Interim medical history since our last visit reviewed. Allergies and medications reviewed and updated. Outpatient Medications Prior to Visit  Medication Sig Dispense Refill  . busPIRone (BUSPAR) 10 MG tablet TAKE 1.5 TABLETS (15 MG TOTAL) BY MOUTH 2 (TWO) TIMES DAILY. 270 tablet 1  . Multiple Vitamins-Minerals (ONE-A-DAY MENS 50+ ADVANTAGE PO) Take 1 tablet by mouth daily.    . Omega-3 Fatty Acids (OMEGA-3 FISH OIL) 1200 MG CAPS Take 1 capsule by mouth daily.    . hydrochlorothiazide (HYDRODIURIL) 25 MG tablet Take 1 tablet (25 mg total) by mouth daily. 30 tablet 6  . lisinopril (ZESTRIL) 20 MG tablet Take 1 tablet (20 mg total) by mouth daily. 90 tablet 3  . LORazepam (ATIVAN) 1 MG tablet Take  0.5-1 tablets (0.5-1 mg total) by mouth 2 (two) times daily as needed for anxiety. 30 tablet 0   No facility-administered medications prior to visit.     Per HPI unless specifically indicated in ROS section below Review of Systems Objective:    BP (!) 134/100 (BP Location: Right Arm, Patient Position: Sitting, Cuff Size: Normal)   Pulse 72   Temp 97.6 F (36.4 C) (Temporal)   Ht 5\' 5"  (1.651 m)   Wt 185 lb 1 oz (83.9 kg)   SpO2 97%   BMI 30.80 kg/m   Wt Readings from Last 3 Encounters:  01/20/20 185 lb 1 oz (83.9 kg)  12/09/19 183 lb 4 oz (83.1 kg)  10/28/19 186 lb 8 oz (84.6 kg)    Physical Exam Vitals and nursing note reviewed.  Constitutional:      Appearance: Normal appearance. He is not ill-appearing.  Cardiovascular:     Rate and Rhythm: Normal rate and regular rhythm.     Pulses: Normal pulses.     Heart sounds: Normal heart sounds. No murmur.  Pulmonary:     Effort: Pulmonary effort is normal. No respiratory distress.     Breath sounds: Normal breath sounds. No wheezing, rhonchi or rales.  Musculoskeletal:     Right lower leg: No edema.     Left lower leg: No edema.  Neurological:     Mental Status: He is alert.  Psychiatric:  Mood and Affect: Mood normal.        Behavior: Behavior normal.       Results for orders placed or performed in visit on 12/19/19  Basic metabolic panel  Result Value Ref Range   Sodium 135 135 - 145 mEq/L   Potassium 4.1 3.5 - 5.1 mEq/L   Chloride 98 96 - 112 mEq/L   CO2 28 19 - 32 mEq/L   Glucose, Bld 117 (H) 70 - 99 mg/dL   BUN 31 (H) 6 - 23 mg/dL   Creatinine, Ser 0.16 0.40 - 1.50 mg/dL   GFR 01.09 (L) >32.35 mL/min   Calcium 9.5 8.4 - 10.5 mg/dL   Assessment & Plan:  This visit occurred during the SARS-CoV-2 public health emergency.  Safety protocols were in place, including screening questions prior to the visit, additional usage of staff PPE, and extensive cleaning of exam room while observing appropriate contact  time as indicated for disinfecting solutions.   Problem List Items Addressed This Visit    Hypertension, essential    Chronic, improving based on home readings. Anticipate component of white coat hypertension based on reading I get at end of visit. Will continue lisinopril hctz 20/25 (now as combo pill) and add metoprolol 25mg  bid. I asked him to monitor BP regularly at home and update me with readings in 2 wks, letting me know if consistently running high. Pt agrees with plan. Pt to monitor sexual dysfunction side effects on medication.       Relevant Medications   lisinopril-hydrochlorothiazide (ZESTORETIC) 20-25 MG tablet   metoprolol tartrate (LOPRESSOR) 25 MG tablet   GAD (generalized anxiety disorder)    Stable period on buspar 15mg  bid and lorazepam PRN anxiety. I was unable to E prescribe lorazepam so I phoned it in today.       Relevant Medications   LORazepam (ATIVAN) 1 MG tablet       Meds ordered this encounter  Medications  . lisinopril-hydrochlorothiazide (ZESTORETIC) 20-25 MG tablet    Sig: Take 1 tablet by mouth daily.    Dispense:  90 tablet    Refill:  3  . metoprolol tartrate (LOPRESSOR) 25 MG tablet    Sig: Take 1 tablet (25 mg total) by mouth 2 (two) times daily.    Dispense:  180 tablet    Refill:  3  . LORazepam (ATIVAN) 1 MG tablet    Sig: Take 0.5-1 tablets (0.5-1 mg total) by mouth 2 (two) times daily as needed for anxiety.    Dispense:  30 tablet    Refill:  0    Not to exceed 5 additional fills before 11/19/2019   No orders of the defined types were placed in this encounter.   Patient Instructions  Start new combo BP medicine lisinopril/hydrochlorothiazide 20/25mg  once daily to replace individual components.  Start new blood pressure medicine metoprolol 25mg  twice daily to help reach goal blood pressures.  Watch for dizziness, fatigue, too slow heart rate.  Continue watching blood pressures at home, send me readings in 3-4 weeks. Return in 3-4  months for follow up visit.    Follow up plan: Return in about 3 months (around 04/21/2020) for follow up visit.  11/21/2019, MD

## 2020-01-20 NOTE — Assessment & Plan Note (Signed)
Stable period on buspar 15mg  bid and lorazepam PRN anxiety. I was unable to E prescribe lorazepam so I phoned it in today.

## 2020-01-20 NOTE — Patient Instructions (Addendum)
Start new combo BP medicine lisinopril/hydrochlorothiazide 20/25mg  once daily to replace individual components.  Start new blood pressure medicine metoprolol 25mg  twice daily to help reach goal blood pressures.  Watch for dizziness, fatigue, too slow heart rate.  Continue watching blood pressures at home, send me readings in 3-4 weeks. Return in 3-4 months for follow up visit.

## 2020-01-20 NOTE — Assessment & Plan Note (Signed)
Chronic, improving based on home readings. Anticipate component of white coat hypertension based on reading I get at end of visit. Will continue lisinopril hctz 20/25 (now as combo pill) and add metoprolol 25mg  bid. I asked him to monitor BP regularly at home and update me with readings in 2 wks, letting me know if consistently running high. Pt agrees with plan. Pt to monitor sexual dysfunction side effects on medication.

## 2020-02-20 DIAGNOSIS — U071 COVID-19: Secondary | ICD-10-CM

## 2020-02-20 HISTORY — DX: COVID-19: U07.1

## 2020-02-28 ENCOUNTER — Ambulatory Visit: Payer: BC Managed Care – PPO

## 2020-03-01 DIAGNOSIS — Z20828 Contact with and (suspected) exposure to other viral communicable diseases: Secondary | ICD-10-CM | POA: Diagnosis not present

## 2020-03-03 ENCOUNTER — Encounter: Payer: Self-pay | Admitting: Family Medicine

## 2020-03-03 NOTE — Telephone Encounter (Signed)
Left message on vm per dpr asking pt to call back.  Need to know when sxs started and what sxs he is still having.   Will also send this message via MyChart.

## 2020-03-07 ENCOUNTER — Other Ambulatory Visit: Payer: Self-pay | Admitting: Family Medicine

## 2020-03-11 NOTE — Telephone Encounter (Addendum)
Spoke with patient. Symptoms started 02/29/2020. Yesterday would end his 10d quarantine but he's still having symptoms (fatigue, cough, dyspnea) so remains out of work.   Tough last 10 days. Residual symptoms of fatigue, mildly productive cough, persistent but not worsening dyspnea.  Taking OTC cough medicine including mucinex.  Also to take advair inhaler + albuterol inhaler prescribe by work.  Reviewed red flags to seek ER care.   I've sent patient info to [facility] infusion team to screen patient.   Misty Stanley - plz call patient daily over the next 2 days for update on symptoms. Anticipate some improvement each day.

## 2020-03-12 ENCOUNTER — Telehealth: Payer: Self-pay

## 2020-03-12 NOTE — Telephone Encounter (Signed)
Noted.  See TE, 03/12/20.

## 2020-03-12 NOTE — Telephone Encounter (Signed)
Noted.  Spoke with for additional details on sxs.  States he has been fever free for last 3-4 days.  Cough and SOB have improved with meds provided by doc at work. Orinda Kenner, see Bridgett's message below].

## 2020-03-12 NOTE — Telephone Encounter (Signed)
Pt returned called and he said the medicine the doctor gave him helped a lot. He was able to get some good rest and he feels he is on the way to recovery. I told him I would give you the message and you will give him a call back.

## 2020-03-12 NOTE — Telephone Encounter (Signed)
Lvm asking pt to call back.  Need update on COVID sxs.

## 2020-03-13 NOTE — Telephone Encounter (Signed)
Left message on vm per dpr asking for update on sxs and name of med prescribed by work doctor.

## 2020-03-13 NOTE — Telephone Encounter (Signed)
Patient returned call  He stated that he is improving Still some Tightness in chest but it is improving daily and much better than it was. Patient stated he still has the cough but with the medication it is doing better.  He is No longer having Sob since he has started taking medication  Patient said there is still some congestion but it is breaking up and clearing     Patient stated his work doctor prescribed  Advair . albuterol And OTC delsym

## 2020-03-13 NOTE — Telephone Encounter (Signed)
plz call again for f/u today. What was name of med he's been started on by work doc?

## 2020-03-17 NOTE — Telephone Encounter (Signed)
Symptoms started 02/29/2020 Prolonged symptoms, treated by occupational health with advair and albuterol.  Called patient for update - no answer at home. Will try later.

## 2020-03-19 NOTE — Telephone Encounter (Addendum)
plz call for last update, see if he's back to work now.  Expect to see daily improvement in dyspnea and fatigue, let us know if that is not the case.

## 2020-03-19 NOTE — Telephone Encounter (Signed)
Spoke to pt. He went back to work on 03-16-20. He is feeling better every day. Appreciated the call.

## 2020-04-12 ENCOUNTER — Other Ambulatory Visit: Payer: Self-pay | Admitting: Family Medicine

## 2020-04-13 NOTE — Telephone Encounter (Signed)
ERx 

## 2020-06-23 ENCOUNTER — Other Ambulatory Visit: Payer: Self-pay | Admitting: Family Medicine

## 2020-06-24 NOTE — Telephone Encounter (Signed)
Name of Medication: Lorazepam Name of Pharmacy: [facility] Last Fill or Written Date and Quantity: 04/13/20, #30 Last Office Visit and Type: 01/20/20, HTN & GAD f/u Next Office Visit and Type: none Last Controlled Substance Agreement Date: none Last UDS: none

## 2020-06-26 NOTE — Telephone Encounter (Signed)
ERx 

## 2020-09-09 ENCOUNTER — Other Ambulatory Visit: Payer: Self-pay | Admitting: Family Medicine

## 2020-09-09 NOTE — Telephone Encounter (Signed)
ERx 

## 2020-09-10 ENCOUNTER — Other Ambulatory Visit: Payer: Self-pay | Admitting: Family Medicine

## 2020-11-15 ENCOUNTER — Other Ambulatory Visit: Payer: Self-pay | Admitting: Family Medicine

## 2020-11-15 DIAGNOSIS — Z125 Encounter for screening for malignant neoplasm of prostate: Secondary | ICD-10-CM

## 2020-11-15 DIAGNOSIS — E785 Hyperlipidemia, unspecified: Secondary | ICD-10-CM | POA: Insufficient documentation

## 2020-11-15 DIAGNOSIS — R739 Hyperglycemia, unspecified: Secondary | ICD-10-CM

## 2020-11-15 DIAGNOSIS — E7800 Pure hypercholesterolemia, unspecified: Secondary | ICD-10-CM

## 2020-11-16 ENCOUNTER — Other Ambulatory Visit: Payer: BC Managed Care – PPO

## 2020-11-19 ENCOUNTER — Other Ambulatory Visit: Payer: Self-pay | Admitting: Family Medicine

## 2020-11-19 NOTE — Telephone Encounter (Signed)
Pharmacy requests refill on: Lorazepam 1 mg   LAST REFILL: 09/09/2020 (Q-30, R-0) LAST OV: 01/19/2021 NEXT OV: 11/23/2020 PHARMACY: [id number] [place], [place]

## 2020-11-20 NOTE — Telephone Encounter (Signed)
ERx 

## 2020-11-21 ENCOUNTER — Encounter: Payer: Self-pay | Admitting: Family Medicine

## 2020-11-23 ENCOUNTER — Encounter: Payer: BC Managed Care – PPO | Admitting: Family Medicine

## 2020-12-17 ENCOUNTER — Other Ambulatory Visit: Payer: Self-pay | Admitting: Family Medicine

## 2021-01-19 ENCOUNTER — Telehealth: Payer: Self-pay | Admitting: Family Medicine

## 2021-01-19 NOTE — Telephone Encounter (Signed)
Pharmacy requests refill on: Lisinopril-hydrochlorothiazide 20-25 mg   LAST REFILL: 01/20/2020 (Q-90, R-3) LAST OV: 01/20/2020 NEXT OV: Not Scheduled  PHARMACY: [id number] [place], [place]

## 2021-01-20 NOTE — Telephone Encounter (Signed)
Noted  

## 2021-01-20 NOTE — Telephone Encounter (Signed)
Called and left vm for the patient to call us to schedule EM

## 2021-01-21 ENCOUNTER — Encounter: Payer: Self-pay | Admitting: Family Medicine

## 2021-02-04 ENCOUNTER — Other Ambulatory Visit: Payer: Self-pay | Admitting: Family Medicine

## 2021-02-05 NOTE — Telephone Encounter (Signed)
ERx 

## 2021-03-01 ENCOUNTER — Other Ambulatory Visit: Payer: BC Managed Care – PPO

## 2021-03-08 ENCOUNTER — Encounter: Payer: BC Managed Care – PPO | Admitting: Family Medicine

## 2021-03-16 IMAGING — DX LEFT HAND - COMPLETE 3+ VIEW
3 series · 3 of 3 positions shown · non-contrast
Comparison: None.

CLINICAL DATA: 53-year-old male with a history of first
carpometacarpal joint pain

EXAM:
LEFT HAND - COMPLETE 3+ VIEW

[hand ap]
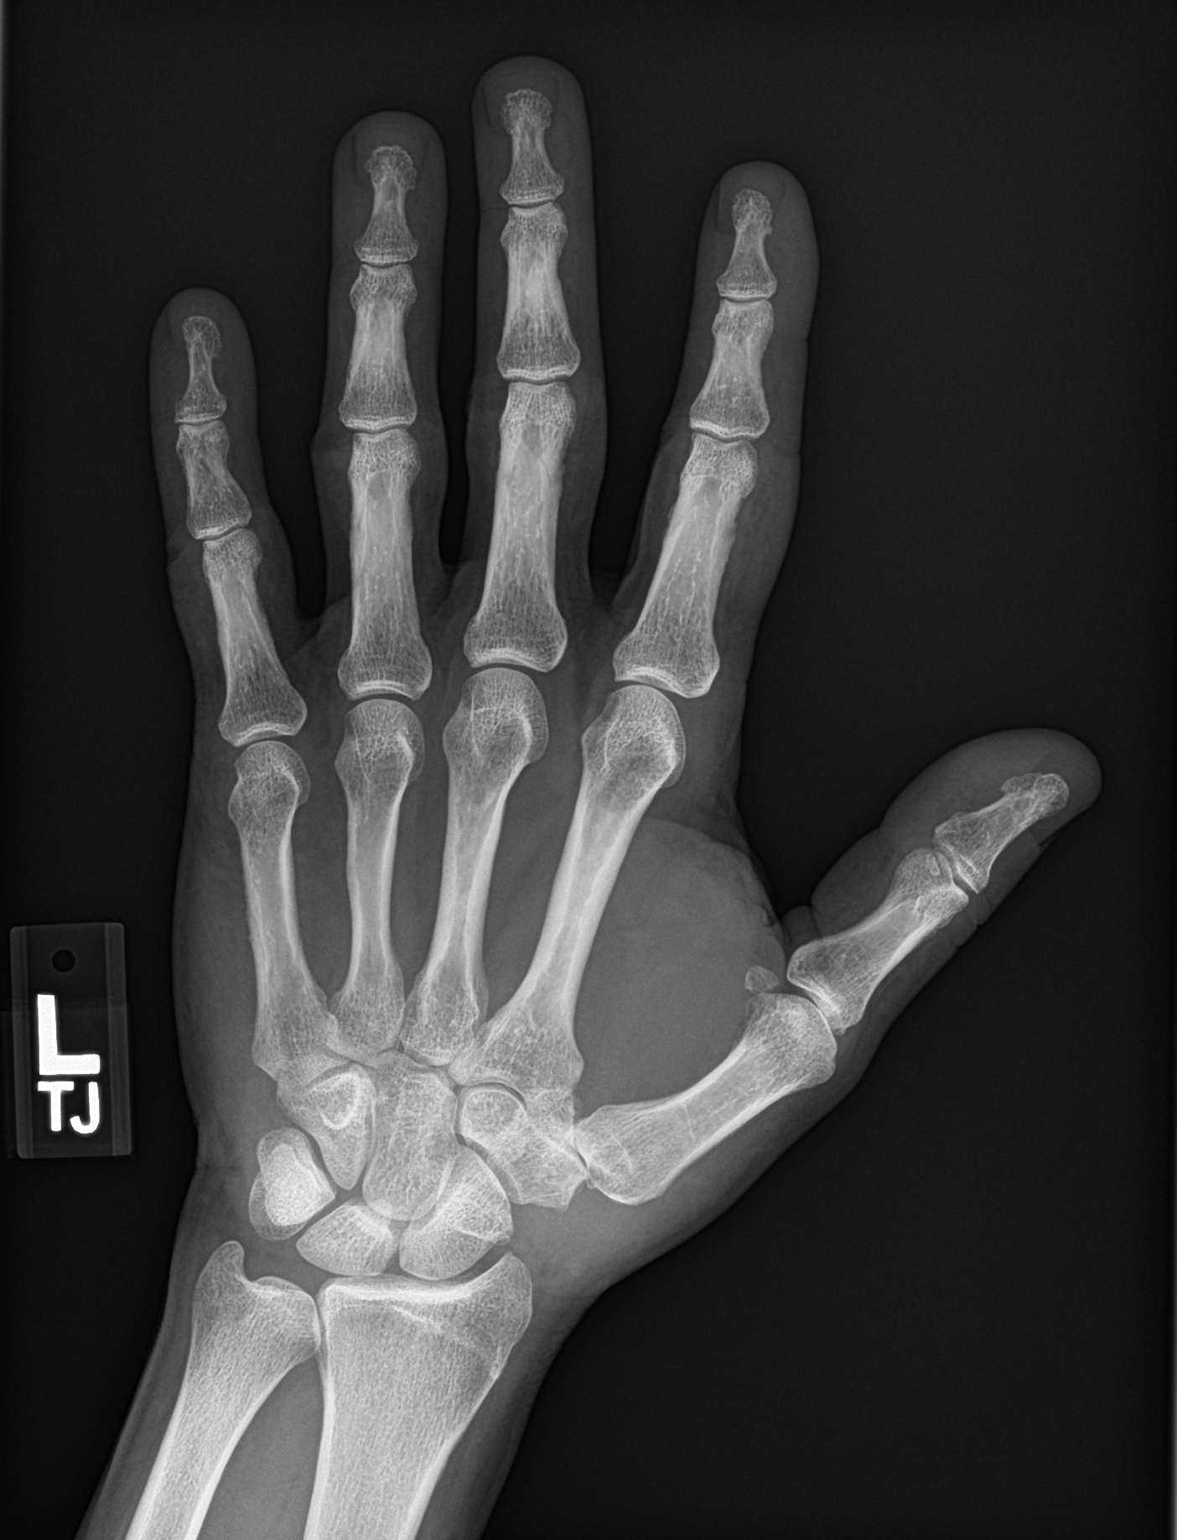

[hand obl]
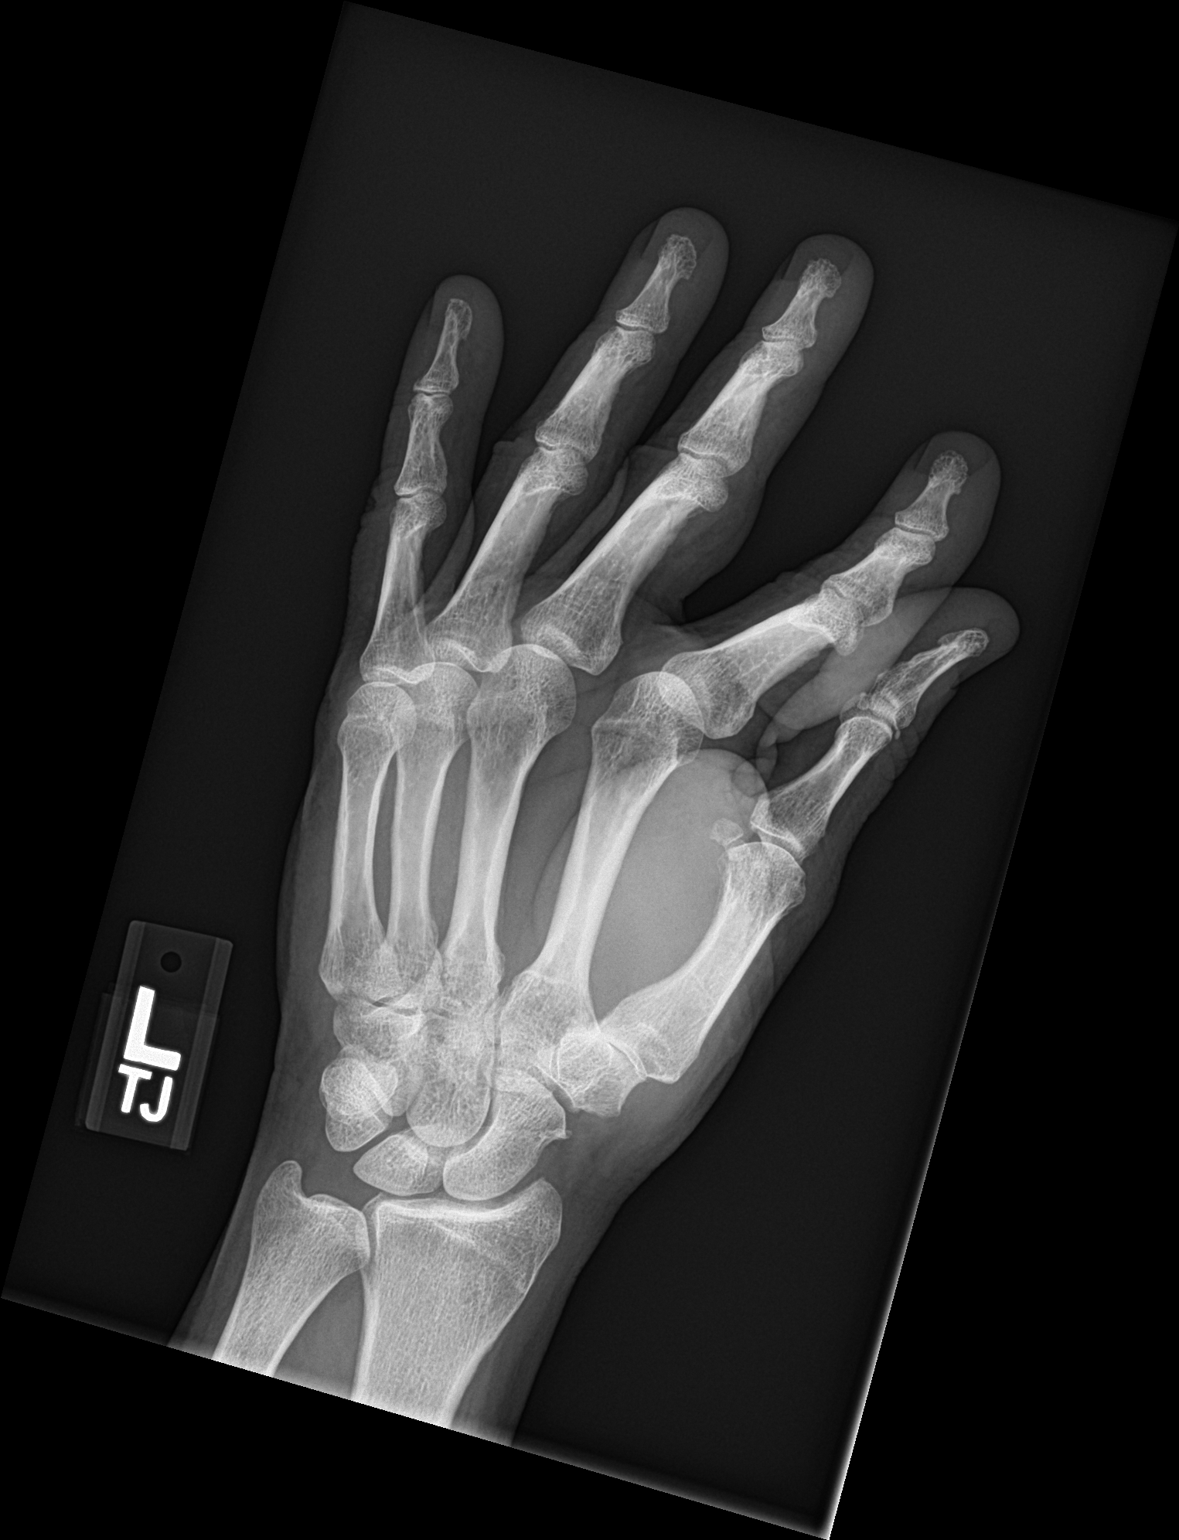

[hand lat]
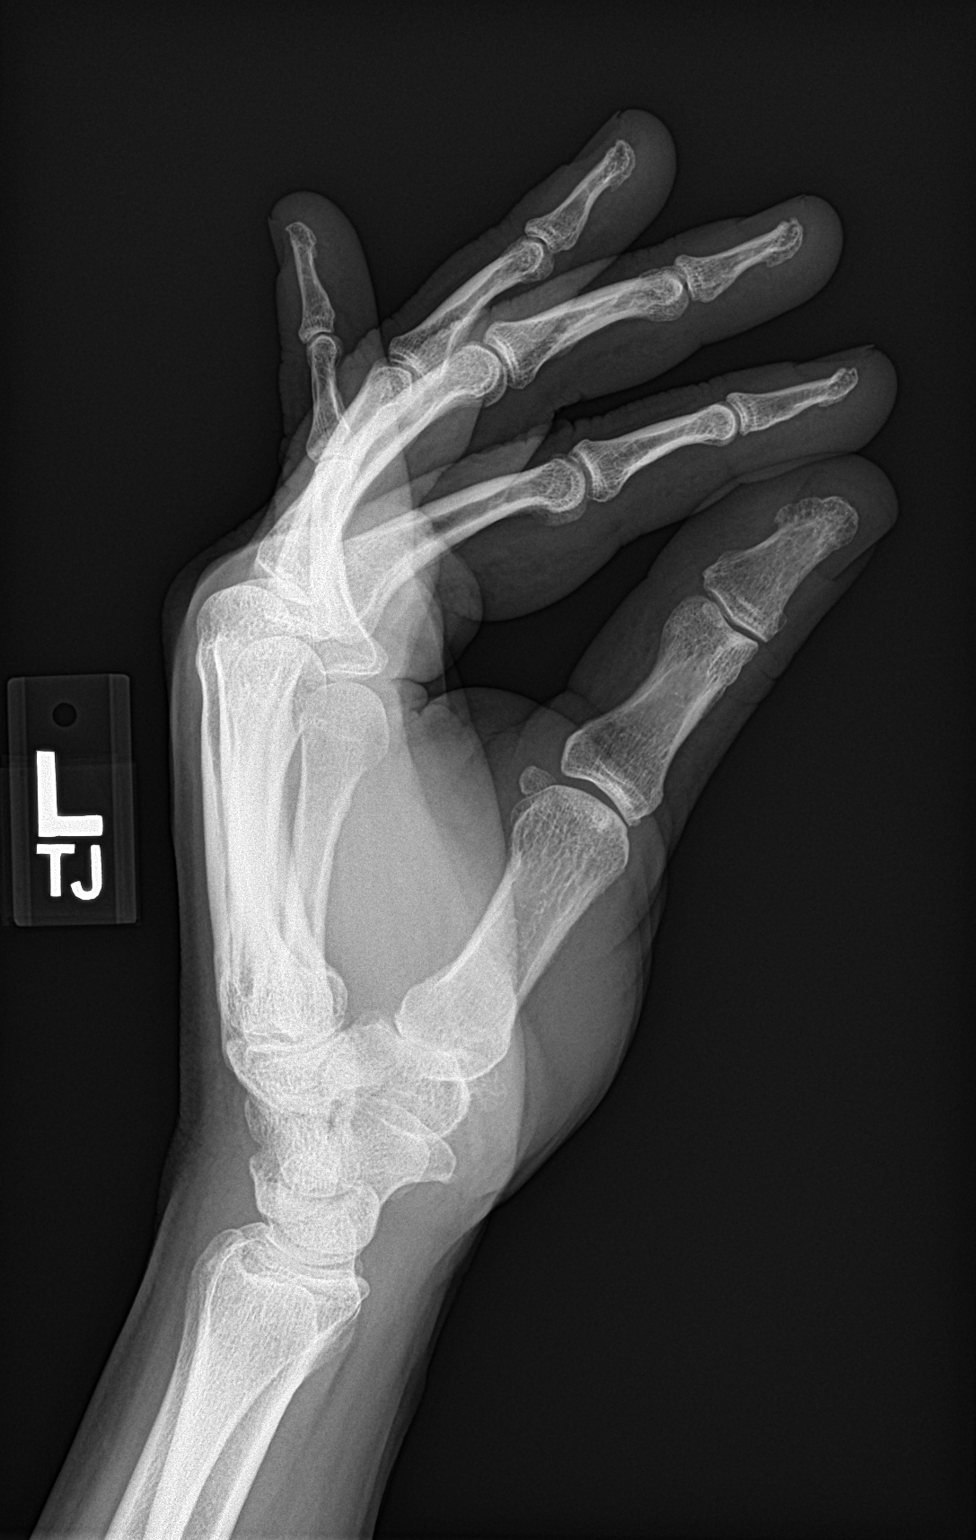

[3 of 3 positions shown; findings below may reference images not displayed]

FINDINGS: No acute displaced fracture. No subluxation/dislocation. No erosive
changes. Mild degenerative changes at the first carpometacarpal
joint. No radiopaque foreign body. No focal soft tissue swelling.
IMPRESSION: Negative for acute bony abnormality.

Degenerative changes at the first carpometacarpal joint

## 2021-04-05 ENCOUNTER — Other Ambulatory Visit: Payer: Self-pay | Admitting: Family Medicine

## 2021-04-16 ENCOUNTER — Other Ambulatory Visit: Payer: Self-pay | Admitting: Family Medicine

## 2021-04-19 ENCOUNTER — Other Ambulatory Visit: Payer: Self-pay | Admitting: Family Medicine

## 2021-04-20 ENCOUNTER — Other Ambulatory Visit: Payer: Self-pay | Admitting: Family Medicine

## 2021-04-21 MED ORDER — LISINOPRIL-HYDROCHLOROTHIAZIDE 20-25 MG PO TABS: 1.0000 | ORAL_TABLET | Freq: Every day | ORAL | 0 refills | Status: DC

## 2021-04-21 NOTE — Telephone Encounter (Signed)
Name of Medication: Lorazepam Name of Pharmacy: [facility] Last Fill or Written Date and Quantity: 02/05/21, #30 Last Office Visit and Type: 01/19/21, HTN & GAD f/u Next Office Visit and Type: 05/03/21, med refill;  06/30/21, CPE Last Controlled Substance Agreement Date: none Last UDS: none

## 2021-04-21 NOTE — Telephone Encounter (Signed)
  LAST APPOINTMENT DATE: 04/19/2021   NEXT APPOINTMENT DATE:@6 /13/2022  MEDICATION: lisinopril- hydrochlorothiazide, lorazepam   PH[facility]ACY: [facility] [address]  Let patient know to contact pharmacy at the end of the day to make sure medication is ready.  Please notify patient to allow 48-72 hours to process  Encourage patient to contact the pharmacy for refills or they can request refills through [facility]  CLINICAL FILLS OUT ALL BELOW:   LAST REFILL:  QTY:  REFILL DATE:    OTHER COMMENTS:    Okay for refill?  Please advise

## 2021-04-21 NOTE — Telephone Encounter (Signed)
LAST APPOINTMENT DATE: [id number]  NEXT APPOINTMENT DATE: Visit date not found. Have called patient l/m to call office to make appointment.     LAST REFILL: 02/05/2021  QTY: 30

## 2021-04-23 NOTE — Telephone Encounter (Signed)
ERx 

## 2021-05-03 ENCOUNTER — Encounter: Payer: Self-pay | Admitting: Family Medicine

## 2021-05-03 ENCOUNTER — Ambulatory Visit: Payer: BC Managed Care – PPO | Admitting: Family Medicine

## 2021-05-03 ENCOUNTER — Ambulatory Visit (INDEPENDENT_AMBULATORY_CARE_PROVIDER_SITE_OTHER)
Admission: RE | Admit: 2021-05-03 | Discharge: 2021-05-03 | Disposition: A | Discharge status: Home / Self Care | Payer: BC Managed Care – PPO | Source: Ambulatory Visit | Attending: Family Medicine | Admitting: Family Medicine

## 2021-05-03 ENCOUNTER — Other Ambulatory Visit: Payer: Self-pay

## 2021-05-03 VITALS — BP 132/82 | HR 72 | Temp 98.6°F | Ht 65.0 in | Wt 199.0 lb

## 2021-05-03 DIAGNOSIS — R0609 Other forms of dyspnea: Secondary | ICD-10-CM

## 2021-05-03 DIAGNOSIS — Z1211 Encounter for screening for malignant neoplasm of colon: Secondary | ICD-10-CM

## 2021-05-03 DIAGNOSIS — M545 Low back pain, unspecified: Secondary | ICD-10-CM | POA: Diagnosis not present

## 2021-05-03 DIAGNOSIS — G8929 Other chronic pain: Secondary | ICD-10-CM | POA: Diagnosis not present

## 2021-05-03 DIAGNOSIS — M1812 Unilateral primary osteoarthritis of first carpometacarpal joint, left hand: Secondary | ICD-10-CM

## 2021-05-03 DIAGNOSIS — F411 Generalized anxiety disorder: Secondary | ICD-10-CM

## 2021-05-03 DIAGNOSIS — U099 Post covid-19 condition, unspecified: Secondary | ICD-10-CM | POA: Insufficient documentation

## 2021-05-03 DIAGNOSIS — I10 Essential (primary) hypertension: Secondary | ICD-10-CM | POA: Diagnosis not present

## 2021-05-03 DIAGNOSIS — R0600 Dyspnea, unspecified: Secondary | ICD-10-CM

## 2021-05-03 HISTORY — DX: Post covid-19 condition, unspecified: U09.9

## 2021-05-03 MED ORDER — ALBUTEROL SULFATE HFA 108 (90 BASE) MCG/ACT IN AERS
2.0000 | INHALATION_SPRAY | Freq: Four times a day (QID) | RESPIRATORY_TRACT | 0 refills | Status: DC | PRN
Start: 2021-05-03 — End: 2021-05-31

## 2021-05-03 MED ORDER — DICLOFENAC SODIUM 1 % EX GEL: 1.0000 "application " | Freq: Three times a day (TID) | CUTANEOUS | 1 refills | Status: DC

## 2021-05-03 MED ORDER — LISINOPRIL-HYDROCHLOROTHIAZIDE 20-25 MG PO TABS: 1.0000 | ORAL_TABLET | Freq: Every day | ORAL | 3 refills | Status: DC

## 2021-05-03 MED ORDER — BUSPIRONE HCL 10 MG PO TABS: 10.0000 mg | ORAL_TABLET | Freq: Two times a day (BID) | ORAL | 3 refills | Status: DC

## 2021-05-03 NOTE — Assessment & Plan Note (Signed)
Stable period - he actually takes 15mg  in the mornings and rarely PM dose along with PRN lorazepam.

## 2021-05-03 NOTE — Patient Instructions (Addendum)
Try albuterol inhaler as needed for shortness of breath. 1-2 puffs at a time.  For left thumb pain - likely first thumb joint wear and tear arthritis. Treat with voltaren gel topically (over the counter) 2-3 times a day as needed. If ongoing, next step is referral to hand surgeon. Let us know if interested.  For lower back pain - baseline xrays today.

## 2021-05-03 NOTE — Assessment & Plan Note (Addendum)
Suspect osteoarthritis related lower back pain.  Check baseline films today. Discussed tylenol use PRN.

## 2021-05-03 NOTE — Assessment & Plan Note (Signed)
Post-COVID dyspnea noted.  Lungs clear today.  Trial albuterol inhaler, consider inhaled corticosteroid trial.

## 2021-05-03 NOTE — Progress Notes (Addendum)
Patient ID: Jonathon Gordon, male    DOB: Apr 20, 1966, 55 y.o.   MRN: [id number]  This visit was conducted in person.  BP 132/82   Pulse 72   Temp 98.6 F (37 C) (Temporal)   Ht 5\' 5"  (1.651 m)   Wt 199 lb (90.3 kg)   SpO2 99%   BMI 33.12 kg/m    CC: med refill visit Subjective:   HPI: Jonathon Gordon is a 55 y.o. male presenting on 05/03/2021 for Hypertension (Have not been checking at home. When has checked have been around what we got in office today. Denies any SOB or selling in legs. )   Will return for CPE.   Suffered COVID infection 02/2020. Symptoms resolved but notes some residual exertional dyspnea and fatigue. Weight gain noted.   Anxiety - ongoing work stressors, overall stable period, managed with buspar 10mg  bid and PRN lorazepam.   HTN - Compliant with current antihypertensive regimen of lisinopril hctz 20/25mg .  Does not check blood pressures at home. No low blood pressure readings or symptoms of dizziness/syncope. Denies HA, vision changes, CP/tightness, SOB, leg swelling.   L thumb cyst becoming more and more painful and activity limiting. Never redness/warmth. Treating with aleve rub with some benefit or oral aleve.   Lower back and buttock pain and stiffness, worse in the mornings - worse with prolonged sitting. Oral aleve helps. H/o remote injuries to lower back, last was skating accident ~20 yrs ago. No shooting pain or numbness/weakness down legs, bowel/bladder accident.   Preventative: Colon cancer screening - overdue - discussed options Prostate cancer screening - continue yearly PSA  Flu shot - yearly COVID vaccine Moderna 09/2020, 10/2020 Tdap 2019 Shingrix vaccine -  Seat belt use discussed Sunscreen use discussed. No changing moles on skin. Ex smoker - quit 8 yrs ago Alcohol - 2-3 beers or some whiskey a day Dentist - due Eye exam - yearly  Caffeine: 1 cup coffee daily Lives with fiance Amy and step son, 2 dogs Occ: [place] - 11/2020 for 26 ppl Activity: planning to start regular exercise Diet: good water, fruits/vegetables, limits fast food, fried foods     Relevant past medical, surgical, family and social history reviewed and updated as indicated. Interim medical history since our last visit reviewed. Allergies and medications reviewed and updated. Outpatient Medications Prior to Visit  Medication Sig Dispense Refill   LORazepam (ATIVAN) 1 MG tablet TAKE 1/2 TO 1 TABLET BY MOUTH TWICE A DAY AS NEEDED FOR ANXIETY 30 tablet 0   Multiple Vitamins-Minerals (ONE-A-DAY MENS 50+ ADVANTAGE PO) Take 1 tablet by mouth daily.     Omega-3 Fatty Acids (OMEGA-3 FISH OIL) 1200 MG CAPS Take 1 capsule by mouth daily.     busPIRone (BUSPAR) 10 MG tablet TAKE 1 TABLET (10 MG TOTAL) BY MOUTH 2 (TWO) TIMES DAILY. NEEDS OFFICE VISIT 180 tablet 0   lisinopril-hydrochlorothiazide (ZESTORETIC) 20-25 MG tablet Take 1 tablet by mouth daily. 90 tablet 0   metoprolol tartrate (LOPRESSOR) 25 MG tablet Take 1 tablet (25 mg total) by mouth 2 (two) times daily. 180 tablet 3   No facility-administered medications prior to visit.     Per HPI unless specifically indicated in ROS section below Review of Systems Objective:  BP 132/82   Pulse 72   Temp 98.6 F (37 C) (Temporal)   Ht 5\' 5"  (1.651 m)   Wt 199 lb (90.3 kg)   SpO2 99%   BMI 33.12  kg/m   Wt Readings from Last 3 Encounters:  05/03/21 199 lb (90.3 kg)  01/20/20 185 lb 1 oz (83.9 kg)  12/09/19 183 lb 4 oz (83.1 kg)      Physical Exam Vitals and nursing note reviewed.  Constitutional:      Appearance: Normal appearance. He is not ill-appearing.  Eyes:     Extraocular Movements: Extraocular movements intact.     Pupils: Pupils are equal, round, and reactive to light.  Neck:     Thyroid: No thyroid mass or thyromegaly.  Cardiovascular:     Rate and Rhythm: Normal rate and regular rhythm.     Pulses: Normal pulses.     Heart sounds: Normal heart sounds. No  murmur heard. Pulmonary:     Effort: Pulmonary effort is normal. No respiratory distress.     Breath sounds: Normal breath sounds. No wheezing, rhonchi or rales.  Musculoskeletal:     Cervical back: Normal range of motion and neck supple. No rigidity.     Right lower leg: No edema.     Left lower leg: No edema.     Comments:  No pain midline spine No paraspinous mm tenderness Neg SLR bilaterally. No pain with int/ext rotation at hip. Neg FABER. No pain at SIJ, GTB or sciatic notch bilaterally.   Lymphadenopathy:     Cervical: No cervical adenopathy.  Skin:    General: Skin is warm and dry.     Findings: No rash.  Neurological:     Mental Status: He is alert.     Sensory: Sensation is intact.     Motor: Motor function is intact.     Comments: 5/5 strength BLE  Psychiatric:        Mood and Affect: Mood normal.        Behavior: Behavior normal.      Results for orders placed or performed in visit on 12/19/19  Basic metabolic panel  Result Value Ref Range   Sodium 135 135 - 145 mEq/L   Potassium 4.1 3.5 - 5.1 mEq/L   Chloride 98 96 - 112 mEq/L   CO2 28 19 - 32 mEq/L   Glucose, Bld 117 (H) 70 - 99 mg/dL   BUN 31 (H) 6 - 23 mg/dL   Creatinine, Ser 8.56 0.40 - 1.50 mg/dL   GFR 31.49 (L) >70.26 mL/min   Calcium 9.5 8.4 - 10.5 mg/dL   Assessment & Plan:  This visit occurred during the SARS-CoV-2 public health emergency.  Safety protocols were in place, including screening questions prior to the visit, additional usage of staff PPE, and extensive cleaning of exam room while observing appropriate contact time as indicated for disinfecting solutions.   Problem List Items Addressed This Visit     Hypertension, essential - Primary    Chronic, stable on current regimen.  Continue current regimen.        Relevant Medications   lisinopril-hydrochlorothiazide (ZESTORETIC) 20-25 MG tablet   GAD (generalized anxiety disorder)    Stable period - he actually takes 15mg  in the  mornings and rarely PM dose along with PRN lorazepam.        Relevant Medications   busPIRone (BUSPAR) 10 MG tablet   Arthritis of carpometacarpal (CMC) joint of left thumb    Confirmed by previous films.  Discussed voltaren gel and referral to hand surgery if ongoing pain.        Chronic bilateral low back pain without sciatica    Suspect osteoarthritis related lower back  pain.  Check baseline films today. Discussed tylenol use PRN.        Relevant Orders   DG Lumbar Spine Complete   Dyspnea    Post-COVID dyspnea noted.  Lungs clear today.  Trial albuterol inhaler, consider inhaled corticosteroid trial.        Other Visit Diagnoses     Special screening for malignant neoplasms, colon       Relevant Orders   Fecal occult blood, imunochemical        Meds ordered this encounter  Medications   busPIRone (BUSPAR) 10 MG tablet    Sig: Take 1 tablet (10 mg total) by mouth 2 (two) times daily.    Dispense:  180 tablet    Refill:  3   lisinopril-hydrochlorothiazide (ZESTORETIC) 20-25 MG tablet    Sig: Take 1 tablet by mouth daily.    Dispense:  90 tablet    Refill:  3   albuterol (VENTOLIN HFA) 108 (90 Base) MCG/ACT inhaler    Sig: Inhale 2 puffs into the lungs every 6 (six) hours as needed for wheezing or shortness of breath.    Dispense:  8 g    Refill:  0   diclofenac Sodium (VOLTAREN) 1 % GEL    Sig: Apply 1 application topically 3 (three) times daily.    Dispense:  100 g    Refill:  1    Orders Placed This Encounter  Procedures   Fecal occult blood, imunochemical    Standing Status:   Future    Standing Expiration Date:   05/03/2022   DG Lumbar Spine Complete    Standing Status:   Future    Number of Occurrences:   1    Standing Expiration Date:   05/03/2022    Order Specific Question:   Reason for Exam (SYMPTOM  OR DIAGNOSIS REQUIRED)    Answer:   chronic low back pain    Order Specific Question:   Preferred imaging location?    Answer:   [facility]    Patient Instructions  Try albuterol inhaler as needed for shortness of breath. 1-2 puffs at a time.  For left thumb pain - likely first thumb joint wear and tear arthritis. Treat with voltaren gel topically (over the counter) 2-3 times a day as needed. If ongoing, next step is referral to hand surgeon. Let us know if interested.  For lower back pain - baseline xrays today.    Follow up plan: Return in about 2 months (around 07/03/2021) for annual exam, prior fasting for blood work.  Eustaquio Boyden, MD

## 2021-05-03 NOTE — Assessment & Plan Note (Addendum)
Confirmed by previous films.  Discussed voltaren gel and referral to hand surgery if ongoing pain.

## 2021-05-03 NOTE — Assessment & Plan Note (Deleted)
Preventative protocols reviewed and updated unless pt declined. Discussed healthy diet and lifestyle.  

## 2021-05-03 NOTE — Assessment & Plan Note (Signed)
Chronic, stable on current regimen.  Continue current regimen.

## 2021-05-03 NOTE — Addendum Note (Signed)
Addended by: Eustaquio Boyden on: 05/03/2021 09:21 AM   Modules accepted: Orders

## 2021-05-10 ENCOUNTER — Other Ambulatory Visit (INDEPENDENT_AMBULATORY_CARE_PROVIDER_SITE_OTHER): Payer: BC Managed Care – PPO

## 2021-05-10 DIAGNOSIS — Z1211 Encounter for screening for malignant neoplasm of colon: Secondary | ICD-10-CM

## 2021-05-10 LAB — FECAL OCCULT BLOOD, IMMUNOCHEMICAL: Fecal Occult Bld: NEGATIVE

## 2021-05-10 LAB — FECAL OCCULT BLOOD, GUAIAC: Fecal Occult Blood: NEGATIVE

## 2021-05-11 ENCOUNTER — Encounter: Payer: Self-pay | Admitting: Family Medicine

## 2021-05-29 ENCOUNTER — Other Ambulatory Visit: Payer: Self-pay | Admitting: Family Medicine

## 2021-06-30 ENCOUNTER — Encounter: Payer: BC Managed Care – PPO | Admitting: Family Medicine

## 2021-07-11 ENCOUNTER — Other Ambulatory Visit: Payer: Self-pay | Admitting: Family Medicine

## 2021-07-12 NOTE — Telephone Encounter (Signed)
Refill request Lorazepam Last refill 04/23/21 #30 Last office visit 05/03/21  Upcoming appointment 07/14/21

## 2021-07-13 NOTE — Telephone Encounter (Signed)
ERx 

## 2021-07-14 ENCOUNTER — Encounter: Payer: BC Managed Care – PPO | Admitting: Family Medicine

## 2021-07-29 ENCOUNTER — Other Ambulatory Visit: Payer: Self-pay | Admitting: Family Medicine

## 2021-07-29 NOTE — Telephone Encounter (Signed)
Voltaren gel Last filled:  06/22/21, #100 g Last OV:  05/03/21, HTN f/u Next OV:  11/09/21, CPE

## 2021-08-06 ENCOUNTER — Other Ambulatory Visit: Payer: Self-pay | Admitting: Family Medicine

## 2021-10-07 ENCOUNTER — Other Ambulatory Visit: Payer: Self-pay | Admitting: Family Medicine

## 2021-10-08 NOTE — Telephone Encounter (Signed)
ERx 

## 2021-11-09 ENCOUNTER — Other Ambulatory Visit: Payer: Self-pay

## 2021-11-09 ENCOUNTER — Encounter: Payer: Self-pay | Admitting: Family Medicine

## 2021-11-09 ENCOUNTER — Ambulatory Visit (INDEPENDENT_AMBULATORY_CARE_PROVIDER_SITE_OTHER): Payer: BC Managed Care – PPO | Admitting: Family Medicine

## 2021-11-09 VITALS — BP 152/90 | HR 89 | Temp 98.0°F | Ht 64.5 in | Wt 194.3 lb

## 2021-11-09 DIAGNOSIS — Z125 Encounter for screening for malignant neoplasm of prostate: Secondary | ICD-10-CM

## 2021-11-09 DIAGNOSIS — Z0000 Encounter for general adult medical examination without abnormal findings: Secondary | ICD-10-CM

## 2021-11-09 DIAGNOSIS — R739 Hyperglycemia, unspecified: Secondary | ICD-10-CM | POA: Diagnosis not present

## 2021-11-09 DIAGNOSIS — R0609 Other forms of dyspnea: Secondary | ICD-10-CM

## 2021-11-09 DIAGNOSIS — I10 Essential (primary) hypertension: Secondary | ICD-10-CM

## 2021-11-09 DIAGNOSIS — E7800 Pure hypercholesterolemia, unspecified: Secondary | ICD-10-CM

## 2021-11-09 DIAGNOSIS — F411 Generalized anxiety disorder: Secondary | ICD-10-CM

## 2021-11-09 DIAGNOSIS — Z23 Encounter for immunization: Secondary | ICD-10-CM

## 2021-11-09 DIAGNOSIS — U099 Post covid-19 condition, unspecified: Secondary | ICD-10-CM

## 2021-11-09 MED ORDER — LISINOPRIL-HYDROCHLOROTHIAZIDE 20-25 MG PO TABS: 1.0000 | ORAL_TABLET | Freq: Every day | ORAL | 3 refills | Status: DC

## 2021-11-09 MED ORDER — AMLODIPINE BESYLATE 2.5 MG PO TABS: 2.5000 mg | ORAL_TABLET | Freq: Every day | ORAL | 3 refills | Status: DC

## 2021-11-09 NOTE — Progress Notes (Signed)
Patient ID: Jonathon Gordon, male    DOB: Dec 12, 1965, 55 y.o.   MRN: [id number]  This visit was conducted in person.  BP (!) 152/90 (BP Location: Right Arm, Cuff Size: Large)    Pulse 89    Temp 98 F (36.7 C) (Temporal)    Ht 5' 4.5" (1.638 m)    Wt 194 lb 5 oz (88.1 kg)    SpO2 97%    BMI 32.84 kg/m   BP Readings from Last 3 Encounters:  11/09/21 (!) 152/90  05/03/21 132/82  01/20/20 (!) 134/100    On repeat 150/96  CC: CPE Subjective:   HPI: Jonathon Gordon is a 55 y.o. male presenting on 11/09/2021 for Annual Exam   Home BP readings elevated - high stressors at work.  Rpt COVID infection 06/2021.   Preventative: Colon cancer screening - iFOB negative 04/2021  Prostate cancer screening - yearly PSA  Lung cancer screening - 15 PY hx - not eligible   Flu shot - yearly COVID vaccine Moderna 09/2020, 10/2020 Tdap 2019 Seat belt use discussed Sunscreen use discussed. No changing moles on skin. 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 Interim medical history since our last visit reviewed. Allergies and medications reviewed and updated. Outpatient Medications Prior to Visit  Medication Sig Dispense Refill   acetaminophen (TYLENOL 8 HOUR ARTHRITIS PAIN) 650 MG CR tablet Take 650 mg by mouth. Takes 2-3 in the morning.     albuterol (VENTOLIN HFA) 108 (90 Base) MCG/ACT inhaler TAKE 2 PUFFS BY MOUTH EVERY 6 HOURS AS NEEDED FOR WHEEZE OR SHORTNESS OF BREATH 8.5 each 3   busPIRone (BUSPAR) 10 MG tablet Take 1 tablet (10 mg total) by mouth 2 (two) times daily. 180  tablet 3   diclofenac Sodium (VOLTAREN) 1 % GEL APPLY 1 APPLICATION TOPICALLY 3 (THREE) TIMES DAILY. 100 g 1   LORazepam (ATIVAN) 1 MG tablet TAKE 1/2 TO 1 TABLET BY MOUTH TWICE A DAY AS NEEDED FOR ANXIETY 30 tablet 0   Multiple Vitamins-Minerals (ONE-A-DAY MENS 50+ ADVANTAGE PO) Take 1 tablet by mouth daily.     Omega-3 Fatty Acids (OMEGA-3 FISH OIL) 1200 MG CAPS Take 1 capsule by mouth daily.     lisinopril-hydrochlorothiazide (ZESTORETIC) 20-25 MG tablet Take 1 tablet by mouth daily. 90 tablet 3   No facility-administered medications prior to visit.     Per HPI unless specifically indicated in ROS section below Review of Systems  Constitutional:  Negative for activity change, appetite change, chills, fatigue, fever and unexpected weight change.  HENT:  Negative for hearing loss.   Eyes:  Negative for visual disturbance.  Respiratory:  Positive for shortness of breath (post covid). Negative for cough, chest tightness and wheezing.   Cardiovascular:  Negative for chest pain, palpitations and leg swelling.  Gastrointestinal:  Positive for diarrhea (loose stools intermittently). Negative for abdominal distention, abdominal pain, blood in stool, constipation, nausea and vomiting.  Genitourinary:  Negative for difficulty urinating and hematuria.  Musculoskeletal:  Negative for arthralgias, myalgias and  neck pain.  Skin:  Negative for rash.  Neurological:  Negative for dizziness, seizures, syncope and headaches.  Hematological:  Negative for adenopathy. Does not bruise/bleed easily.  Psychiatric/Behavioral:  Negative for dysphoric mood. The patient is nervous/anxious.    Objective:  BP (!) 152/90 (BP Location: Right Arm, Cuff Size: Large)    Pulse 89    Temp 98 F (36.7 C) (Temporal)    Ht 5' 4.5" (1.638 m)    Wt 194 lb 5 oz (88.1 kg)    SpO2 97%    BMI 32.84 kg/m   Wt Readings from Last 3 Encounters:  11/09/21 194 lb 5 oz (88.1 kg)  05/03/21 199 lb (90.3 kg)  01/20/20 185 lb 1 oz (83.9  kg)      Physical Exam Vitals and nursing note reviewed.  Constitutional:      General: He is not in acute distress.    Appearance: Normal appearance. He is well-developed. He is not ill-appearing.  HENT:     Head: Normocephalic and atraumatic.     Right Ear: Hearing, tympanic membrane, ear canal and external ear normal.     Left Ear: Hearing, tympanic membrane, ear canal and external ear normal.  Eyes:     General: No scleral icterus.    Extraocular Movements: Extraocular movements intact.     Conjunctiva/sclera: Conjunctivae normal.     Pupils: Pupils are equal, round, and reactive to light.  Neck:     Thyroid: No thyroid mass or thyromegaly.  Cardiovascular:     Rate and Rhythm: Normal rate and regular rhythm.     Pulses: Normal pulses.          Radial pulses are 2+ on the right side and 2+ on the left side.     Heart sounds: Normal heart sounds. No murmur heard. Pulmonary:     Effort: Pulmonary effort is normal. No respiratory distress.     Breath sounds: Normal breath sounds. No wheezing, rhonchi or rales.  Abdominal:     General: Bowel sounds are normal. There is no distension.     Palpations: Abdomen is soft. There is no mass.     Tenderness: There is no abdominal tenderness. There is no guarding or rebound.     Hernia: No hernia is present.  Musculoskeletal:        General: Normal range of motion.     Cervical back: Normal range of motion and neck supple.     Right lower leg: No edema.     Left lower leg: No edema.  Lymphadenopathy:     Cervical: No cervical adenopathy.  Skin:    General: Skin is warm and dry.     Findings: No rash.  Neurological:     General: No focal deficit present.     Mental Status: He is alert and oriented to person, place, and time.  Psychiatric:        Mood and Affect: Mood normal.        Behavior: Behavior normal.        Thought Content: Thought content normal.        Judgment: Judgment normal.      Results for orders placed or  performed in visit on 05/11/21  Fecal Occult Blood, Guaiac  Result Value Ref Range   Fecal Occult Blood Negative     Assessment & Plan:  This visit occurred during the SARS-CoV-2 public health emergency.  Safety protocols were in place, including screening questions prior to the visit,  additional usage of staff PPE, and extensive cleaning of exam room while observing appropriate contact time as indicated for disinfecting solutions.   Problem List Items Addressed This Visit     Health maintenance examination - Primary (Chronic)    Preventative protocols reviewed and updated unless pt declined. Discussed healthy diet and lifestyle.       Hypertension, essential    Chronic, uncontrolled - increased BP readings attributed to increased work stressors - will add amlodipine 2.5mg  daily, discussed monitoring for pedal edema.      Relevant Medications   amLODipine (NORVASC) 2.5 MG tablet   lisinopril-hydrochlorothiazide (ZESTORETIC) 20-25 MG tablet   Other Relevant Orders   TSH   GAD (generalized anxiety disorder)    Chronic, overall stable period on buspar 10mg  and PRN lorazepam. Given increased work stress this time of year, rec trial buspar 10mg  BID, update with effect.       Hyperglycemia    Update A1c.       Relevant Orders   Hemoglobin A1c   Hyperlipidemia    Update FLP off statin. The 10-year ASCVD risk score (Arnett DK, et al., 2019) is: 8.5%   Values used to calculate the score:     Age: 37 years     Sex: Male     Is Non-Hispanic African American: No     Diabetic: No     Tobacco smoker: No     Systolic Blood Pressure: 152 mmHg     Is BP treated: Yes     HDL Cholesterol: 76.4 mg/dL     Total Cholesterol: 226 mg/dL       Relevant Medications   amLODipine (NORVASC) 2.5 MG tablet   lisinopril-hydrochlorothiazide (ZESTORETIC) 20-25 MG tablet   Other Relevant Orders   Lipid panel   Comprehensive metabolic panel   Post-COVID chronic dyspnea    Seems to slowly be  improving but persistent from first COVID infection 2021      Relevant Orders   CBC with Differential/Platelet   TSH   Other Visit Diagnoses     Need for influenza vaccination       Relevant Orders   Flu Vaccine QUAD 12mo+IM (Fluarix, Fluzone & Alfiuria Quad PF) (Completed)   Special screening for malignant neoplasm of prostate       Relevant Orders   PSA        Meds ordered this encounter  Medications   amLODipine (NORVASC) 2.5 MG tablet    Sig: Take 1 tablet (2.5 mg total) by mouth daily.    Dispense:  90 tablet    Refill:  3   lisinopril-hydrochlorothiazide (ZESTORETIC) 20-25 MG tablet    Sig: Take 1 tablet by mouth daily.    Dispense:  90 tablet    Refill:  3   Orders Placed This Encounter  Procedures   Flu Vaccine QUAD 33mo+IM (Fluarix, Fluzone & Alfiuria Quad PF)   Lipid panel   Comprehensive metabolic panel   Hemoglobin A1c   PSA   CBC with Differential/Platelet   TSH    Patient instructions: Flu shot today  Labs today  BP staying too high - add on 2nd BP medicine amlodipine 2.5mg  once daily in addition to lisinopril hctz 20/25mg  daily.  Return in 6 months for BP f/u visit, sooner if needed.   Follow up plan: Return in about 6 months (around 05/10/2022) for follow up visit.  5mo, MD

## 2021-11-09 NOTE — Assessment & Plan Note (Signed)
Update FLP off statin. The 10-year ASCVD risk score (Arnett DK, et al., 2019) is: 8.5%   Values used to calculate the score:     Age: 55 years     Sex: Male     Is Non-Hispanic African American: No     Diabetic: No     Tobacco smoker: No     Systolic Blood Pressure: 152 mmHg     Is BP treated: Yes     HDL Cholesterol: 76.4 mg/dL     Total Cholesterol: 226 mg/dL

## 2021-11-09 NOTE — Assessment & Plan Note (Signed)
Seems to slowly be improving but persistent from first COVID infection 2021

## 2021-11-09 NOTE — Assessment & Plan Note (Signed)
Preventative protocols reviewed and updated unless pt declined. Discussed healthy diet and lifestyle.  

## 2021-11-09 NOTE — Assessment & Plan Note (Signed)
Update A1c ?

## 2021-11-09 NOTE — Assessment & Plan Note (Signed)
Chronic, uncontrolled - increased BP readings attributed to increased work stressors - will add amlodipine 2.5mg  daily, discussed monitoring for pedal edema.

## 2021-11-09 NOTE — Assessment & Plan Note (Signed)
Chronic, overall stable period on buspar 10mg  and PRN lorazepam. Given increased work stress this time of year, rec trial buspar 10mg  BID, update with effect.

## 2021-11-09 NOTE — Patient Instructions (Addendum)
Flu shot today  Labs today  BP staying too high - add on 2nd BP medicine amlodipine 2.5mg  once daily in addition to lisinopril hctz 20/25mg  daily.  Return in 6 months for BP f/u visit, sooner if needed.   Health Maintenance, Male Adopting a healthy lifestyle and getting preventive care are important in promoting health and wellness. Ask your health care provider about: The right schedule for you to have regular tests and exams. Things you can do on your own to prevent diseases and keep yourself healthy. What should I know about diet, weight, and exercise? Eat a healthy diet  Eat a diet that includes plenty of vegetables, fruits, low-fat dairy products, and lean protein. Do not eat a lot of foods that are high in solid fats, added sugars, or sodium. Maintain a healthy weight Body mass index (BMI) is a measurement that can be used to identify possible weight problems. It estimates body fat based on height and weight. Your health care provider can help determine your BMI and help you achieve or maintain a healthy weight. Get regular exercise Get regular exercise. This is one of the most important things you can do for your health. Most adults should: Exercise for at least 150 minutes each week. The exercise should increase your heart rate and make you sweat (moderate-intensity exercise). Do strengthening exercises at least twice a week. This is in addition to the moderate-intensity exercise. Spend less time sitting. Even light physical activity can be beneficial. Watch cholesterol and blood lipids Have your blood tested for lipids and cholesterol at 56 years of age, then have this test every 5 years. You may need to have your cholesterol levels checked more often if: Your lipid or cholesterol levels are high. You are older than 57 years of age. You are at high risk for heart disease. What should I know about cancer screening? Many types of cancers can be detected early and may often be  prevented. Depending on your health history and family history, you may need to have cancer screening at various ages. This may include screening for: Colorectal cancer. Prostate cancer. Skin cancer. Lung cancer. What should I know about heart disease, diabetes, and high blood pressure? Blood pressure and heart disease High blood pressure causes heart disease and increases the risk of stroke. This is more likely to develop in people who have high blood pressure readings or are overweight. Talk with your health care provider about your target blood pressure readings. Have your blood pressure checked: Every 3-5 years if you are 20-42 years of age. Every year if you are 49 years old or older. If you are between the ages of 68 and 38 and are a current or former smoker, ask your health care provider if you should have a one-time screening for abdominal aortic aneurysm (AAA). Diabetes Have regular diabetes screenings. This checks your fasting blood sugar level. Have the screening done: Once every three years after age 48 if you are at a normal weight and have a low risk for diabetes. More often and at a younger age if you are overweight or have a high risk for diabetes. What should I know about preventing infection? Hepatitis B If you have a higher risk for hepatitis B, you should be screened for this virus. Talk with your health care provider to find out if you are at risk for hepatitis B infection. Hepatitis C Blood testing is recommended for: Everyone born from [birth year] through 1965. Anyone with known risk factors  for hepatitis C. Sexually transmitted infections (STIs) You should be screened each year for STIs, including gonorrhea and chlamydia, if: You are sexually active and are younger than 57 years of age. You are older than 57 years of age and your health care provider tells you that you are at risk for this type of infection. Your sexual activity has changed since you were last screened,  and you are at increased risk for chlamydia or gonorrhea. Ask your health care provider if you are at risk. Ask your health care provider about whether you are at high risk for HIV. Your health care provider may recommend a prescription medicine to help prevent HIV infection. If you choose to take medicine to prevent HIV, you should first get tested for HIV. You should then be tested every 3 months for as long as you are taking the medicine. Follow these instructions at home: Alcohol use Do not drink alcohol if your health care provider tells you not to drink. If you drink alcohol: Limit how much you have to 0-2 drinks a day. Know how much alcohol is in your drink. In the [place] one drink equals one 12 oz bottle of beer (355 mL), one 5 oz glass of wine (148 mL), or one 1 oz glass of hard liquor (44 mL). Lifestyle Do not use any products that contain nicotine or tobacco. These products include cigarettes, chewing tobacco, and vaping devices, such as e-cigarettes. If you need help quitting, ask your health care provider. Do not use street drugs. Do not share needles. Ask your health care provider for help if you need support or information about quitting drugs. General instructions Schedule regular health, dental, and eye exams. Stay current with your vaccines. Tell your health care provider if: You often feel depressed. You have ever been abused or do not feel safe at home. Summary Adopting a healthy lifestyle and getting preventive care are important in promoting health and wellness. Follow your health care provider's instructions about healthy diet, exercising, and getting tested or screened for diseases. Follow your health care provider's instructions on monitoring your cholesterol and blood pressure. This information is not intended to replace advice given to you by your health care provider. Make sure you discuss any questions you have with your health care provider. Document Revised:  03/29/2021 Document Reviewed: 03/29/2021 Elsevier Patient Education  [place].

## 2021-11-10 LAB — PSA: PSA: 0.28 ng/mL (ref 0.10–4.00)

## 2021-11-10 LAB — COMPREHENSIVE METABOLIC PANEL
ALT: 30 U/L (ref 0–53)
AST: 19 U/L (ref 0–37)
Albumin: 4.8 g/dL (ref 3.5–5.2)
Alkaline Phosphatase: 67 U/L (ref 39–117)
BUN: 24 mg/dL — ABNORMAL HIGH (ref 6–23)
CO2: 25 mEq/L (ref 19–32)
Calcium: 10.1 mg/dL (ref 8.4–10.5)
Chloride: 97 mEq/L (ref 96–112)
Creatinine, Ser: 1.27 mg/dL (ref 0.40–1.50)
GFR: 62.71 mL/min (ref >60.00)
Glucose, Bld: 105 mg/dL — ABNORMAL HIGH (ref 70–99)
Potassium: 4 mEq/L (ref 3.5–5.1)
Sodium: 134 mEq/L — ABNORMAL LOW (ref 135–145)
Total Bilirubin: 0.8 mg/dL (ref 0.2–1.2)
Total Protein: 7.8 g/dL (ref 6.0–8.3)

## 2021-11-10 LAB — CBC WITH DIFFERENTIAL/PLATELET
Basophils Absolute: 0.1 10*3/uL (ref 0.0–0.1)
Basophils Relative: 0.9 % (ref 0.0–3.0)
Eosinophils Absolute: 0.1 10*3/uL (ref 0.0–0.7)
Eosinophils Relative: 2 % (ref 0.0–5.0)
HCT: 41.9 % (ref 39.0–52.0)
Hemoglobin: 14.4 g/dL (ref 13.0–17.0)
Lymphocytes Relative: 17.5 % (ref 12.0–46.0)
Lymphs Abs: 1.2 10*3/uL (ref 0.7–4.0)
MCHC: 34.3 g/dL (ref 30.0–36.0)
MCV: 96.3 fl (ref 78.0–100.0)
Monocytes Absolute: 0.6 10*3/uL (ref 0.1–1.0)
Monocytes Relative: 8.5 % (ref 3.0–12.0)
Neutro Abs: 4.8 10*3/uL (ref 1.4–7.7)
Neutrophils Relative %: 71.1 % (ref 43.0–77.0)
Platelets: 232 10*3/uL (ref 150.0–400.0)
RBC: 4.35 Mil/uL (ref 4.22–5.81)
RDW: 13.6 % (ref 11.5–15.5)
WBC: 6.8 10*3/uL (ref 4.0–10.5)

## 2021-11-10 LAB — LIPID PANEL
Cholesterol: 233 mg/dL — ABNORMAL HIGH (ref 0–200)
HDL: 79.9 mg/dL (ref >39.00)
LDL Cholesterol: 131 mg/dL — ABNORMAL HIGH (ref 0–99)
NonHDL: 152.71
Total CHOL/HDL Ratio: 3
Triglycerides: 108 mg/dL (ref 0.0–149.0)
VLDL: 21.6 mg/dL (ref 0.0–40.0)

## 2021-11-10 LAB — TSH: TSH: 0.79 u[IU]/mL (ref 0.35–5.50)

## 2021-11-10 LAB — HEMOGLOBIN A1C: Hgb A1c MFr Bld: 5.7 % (ref 4.6–6.5)

## 2021-12-01 ENCOUNTER — Ambulatory Visit (INDEPENDENT_AMBULATORY_CARE_PROVIDER_SITE_OTHER)
Admission: RE | Admit: 2021-12-01 | Discharge: 2021-12-01 | Disposition: A | Discharge status: Home / Self Care | Payer: BC Managed Care – PPO | Source: Ambulatory Visit | Attending: Family Medicine | Admitting: Family Medicine

## 2021-12-01 ENCOUNTER — Ambulatory Visit: Payer: BC Managed Care – PPO | Admitting: Family Medicine

## 2021-12-01 ENCOUNTER — Encounter: Payer: Self-pay | Admitting: Family Medicine

## 2021-12-01 ENCOUNTER — Other Ambulatory Visit: Payer: Self-pay

## 2021-12-01 VITALS — BP 148/98 | HR 82 | Temp 97.9°F | Ht 64.5 in | Wt 195.4 lb

## 2021-12-01 DIAGNOSIS — M79671 Pain in right foot: Secondary | ICD-10-CM | POA: Diagnosis not present

## 2021-12-01 DIAGNOSIS — I10 Essential (primary) hypertension: Secondary | ICD-10-CM | POA: Diagnosis not present

## 2021-12-01 DIAGNOSIS — M79672 Pain in left foot: Secondary | ICD-10-CM | POA: Diagnosis not present

## 2021-12-01 NOTE — Assessment & Plan Note (Addendum)
Not consistent with plantar fasciitis.  Possible metatarsal stress fx vs capsulitis, tendonitis, cyst.  Check xrays today --> anticipate 2nd MT fracture.  rec stop current firm insoles.  rec tylenol 650mg  2 tablets twice daily for pain.  Will place in post-op shoe for 4 wks then RTC for f/u visit.

## 2021-12-01 NOTE — Progress Notes (Signed)
Patient ID: Jonathon Gordon, male    DOB: 08-21-1966, 56 y.o.   MRN: [id number]  This visit was conducted in person.  BP (!) 148/98 (BP Location: Right Arm, Cuff Size: Normal)    Pulse 82    Temp 97.9 F (36.6 C) (Temporal)    Ht 5' 4.5" (1.638 m)    Wt 195 lb 6 oz (88.6 kg)    SpO2 97%    BMI 33.02 kg/m   BP Readings from Last 3 Encounters:  12/01/21 (!) 148/98  11/09/21 (!) 152/90  05/03/21 132/82  156/98 on repeat testing   CC: foot pain Subjective:   HPI: Jonathon Gordon is a 56 y.o. male presenting on 12/01/2021 for Foot Pain (C/o right foot pain on the ball and on top of foot.  Started about 5 mos ago in in heel then moved to the arch.  Tried Voltaren gel, barely helpful. )   5 month history of R foot pain.  This started at the heel after several hours walking on cement. Progressively worsening, over the past 3 weeks now with pain to midfoot. Notes some swelling. Denies inciting trauma/injury or fall.  No fever, warmth, redness Tried insoles to right shoes without much benefit - he's been using stiff undersoles under regular shoe soles.  Increased walk exacerbates pain. Pain also exacerbated when squatting on ball of feet.  Ice and tylenol helps temporarily.   No prior foot surgery  H/o remote left plantar fasciitis that did improve over time.   Came here from work today. BP elevated despite lisinopril/hctz 20/25mg  - has not had a chance to start amlodipine 2.5mg  daily yet.      Relevant past medical, surgical, family and social history reviewed and updated as indicated. Interim medical history since our last visit reviewed. Allergies and medications reviewed and updated. Outpatient Medications Prior to Visit  Medication Sig Dispense Refill   acetaminophen (TYLENOL) 650 MG CR tablet Take 650 mg by mouth. Takes 2-3 in the morning.     albuterol (VENTOLIN HFA) 108 (90 Base) MCG/ACT inhaler TAKE 2 PUFFS BY MOUTH EVERY 6 HOURS AS NEEDED FOR WHEEZE OR SHORTNESS OF BREATH 8.5  each 3   amLODipine (NORVASC) 2.5 MG tablet Take 1 tablet (2.5 mg total) by mouth daily. 90 tablet 3   busPIRone (BUSPAR) 10 MG tablet Take 1 tablet (10 mg total) by mouth 2 (two) times daily. 180 tablet 3   diclofenac Sodium (VOLTAREN) 1 % GEL APPLY 1 APPLICATION TOPICALLY 3 (THREE) TIMES DAILY. 100 g 1   lisinopril-hydrochlorothiazide (ZESTORETIC) 20-25 MG tablet Take 1 tablet by mouth daily. 90 tablet 3   LORazepam (ATIVAN) 1 MG tablet TAKE 1/2 TO 1 TABLET BY MOUTH TWICE A DAY AS NEEDED FOR ANXIETY 30 tablet 0   Multiple Vitamins-Minerals (ONE-A-DAY MENS 50+ ADVANTAGE PO) Take 1 tablet by mouth daily.     Omega-3 Fatty Acids (OMEGA-3 FISH OIL) 1200 MG CAPS Take 1 capsule by mouth daily.     No facility-administered medications prior to visit.     Per HPI unless specifically indicated in ROS section below Review of Systems  Objective:  BP (!) 148/98 (BP Location: Right Arm, Cuff Size: Normal)    Pulse 82    Temp 97.9 F (36.6 C) (Temporal)    Ht 5' 4.5" (1.638 m)    Wt 195 lb 6 oz (88.6 kg)    SpO2 97%    BMI 33.02 kg/m   Wt Readings from Last 3  Encounters:  12/01/21 195 lb 6 oz (88.6 kg)  11/09/21 194 lb 5 oz (88.1 kg)  05/03/21 199 lb (90.3 kg)      Physical Exam Vitals and nursing note reviewed.  Constitutional:      Appearance: Normal appearance. He is not ill-appearing.  Musculoskeletal:        General: Swelling (R midfoot) and tenderness present. Normal range of motion.     Right lower leg: No edema.     Left lower leg: No edema.     Comments:  1+ DP bilaterally L foot WNL R foot: No pain at achilles or heel No ankle ligament laxity with testing Neg calc squeeze test  No pain at navicular or at base of 5th MT FROM toes in ext/flexion Point tender with swelling dorsal mid 2nd-4th metatarsals, worst pain at mid 3rd MT shaft  Skin:    General: Skin is warm and dry.     Findings: No erythema or rash.  Neurological:     Mental Status: He is alert.  Psychiatric:         Mood and Affect: Mood normal.        Behavior: Behavior normal.       Assessment & Plan:  This visit occurred during the SARS-CoV-2 public health emergency.  Safety protocols were in place, including screening questions prior to the visit, additional usage of staff PPE, and extensive cleaning of exam room while observing appropriate contact time as indicated for disinfecting solutions.   Problem List Items Addressed This Visit     Hypertension, essential    BP elevated today - has not started amlodipine yet - advised go ahead and start, continue lisinopril/hctz.       Right foot pain - Primary    Not consistent with plantar fasciitis.  Possible metatarsal stress fx vs capsulitis, tendonitis, cyst.  Check xrays today --> anticipate 2nd MT fracture.  rec stop current firm insoles.  rec tylenol 650mg  2 tablets twice daily for pain.  Will place in post-op shoe for 4 wks then RTC for f/u visit.       Relevant Orders   DG Foot Complete Right     No orders of the defined types were placed in this encounter.  Orders Placed This Encounter  Procedures   DG Foot Complete Right    Standing Status:   Future    Number of Occurrences:   1    Standing Expiration Date:   12/01/2022    Order Specific Question:   Reason for Exam (SYMPTOM  OR DIAGNOSIS REQUIRED)    Answer:   R metatarsal foot pain for months eval stress fx    Order Specific Question:   Preferred imaging location?    Answer:   01/30/2023     Patient Instructions  Right foot xray today - suspicious for 2nd metatarsal fracture.  Use post op shoe for 4 weeks.  Gentle stretching, may continue icing foot, especially after long time on your feet.  May continue tylenol, take 2 tablets (650mg ) twice daily for the next week.  Return in 4 weeks for follow up visit.   Follow up plan: Return in about 4 weeks (around 12/29/2021), or if symptoms worsen or fail to improve, for follow up visit.  , MD

## 2021-12-01 NOTE — Assessment & Plan Note (Signed)
BP elevated today - has not started amlodipine yet - advised go ahead and start, continue lisinopril/hctz.

## 2021-12-01 NOTE — Patient Instructions (Addendum)
Right foot xray today - suspicious for 2nd metatarsal fracture.  Use post op shoe for 4 weeks.  Gentle stretching, may continue icing foot, especially after long time on your feet.  May continue tylenol, take 2 tablets (650mg ) twice daily for the next week.  Return in 4 weeks for follow up visit.

## 2021-12-02 ENCOUNTER — Telehealth: Payer: Self-pay

## 2021-12-02 NOTE — Telephone Encounter (Signed)
Noted.  I'll defer to PCP. Thanks.

## 2021-12-02 NOTE — Telephone Encounter (Signed)
This was addressed yesterday

## 2021-12-02 NOTE — Telephone Encounter (Signed)
Sheryl with [facility] called report on rt foot xray. Pt is not waiting. Pt has nondisplaced fx 2nd metatarsal. Report is in Epic. Sending note to Dr Darnell Level who is out of office, Dr Damita Dunnings who is in office and Lattie Haw CMA and will teams [place].

## 2021-12-07 ENCOUNTER — Other Ambulatory Visit: Payer: Self-pay | Admitting: Family Medicine

## 2021-12-07 NOTE — Telephone Encounter (Signed)
Refill request Lorazepam Last refill 10/08/21 #30 Last office visit 12/01/21

## 2021-12-08 NOTE — Telephone Encounter (Signed)
ERx 

## 2021-12-13 ENCOUNTER — Encounter: Payer: Self-pay | Admitting: Family Medicine

## 2021-12-29 ENCOUNTER — Ambulatory Visit: Payer: BC Managed Care – PPO | Admitting: Family Medicine

## 2022-02-22 ENCOUNTER — Other Ambulatory Visit: Payer: Self-pay | Admitting: Family Medicine

## 2022-02-23 NOTE — Telephone Encounter (Signed)
Refill request Lorazepam ?Last refill 12/08/21 #30 ?Last office visit 12/01/21 ? ?

## 2022-02-24 NOTE — Telephone Encounter (Signed)
ERx 

## 2022-04-11 ENCOUNTER — Telehealth: Payer: BC Managed Care – PPO | Admitting: Emergency Medicine

## 2022-04-11 DIAGNOSIS — J069 Acute upper respiratory infection, unspecified: Secondary | ICD-10-CM

## 2022-04-11 MED ORDER — BENZONATATE 100 MG PO CAPS: 100.0000 mg | ORAL_CAPSULE | Freq: Two times a day (BID) | ORAL | 0 refills | Status: DC | PRN

## 2022-04-11 MED ORDER — AZELASTINE HCL 0.1 % NA SOLN: 2.0000 | Freq: Two times a day (BID) | NASAL | 0 refills | Status: DC

## 2022-04-11 NOTE — Progress Notes (Signed)
E-Visit for Upper Respiratory Infection   We are sorry you are not feeling well.  Here is how we plan to help!  Based on what you have shared with me, it looks like you may have a viral upper respiratory infection.  Upper respiratory infections are caused by a large number of viruses; however, rhinovirus is the most common cause.   Your symptoms sound like the symptoms of COVID -  please get tested for COVID - a home rapid test is fine to use.   Symptoms vary from person to person, with common symptoms including sore throat, cough, fatigue or lack of energy and feeling of general discomfort.  A low-grade fever of up to 100.4 may present, but is often uncommon.  Symptoms vary however, and are closely related to a person's age or underlying illnesses.  The most common symptoms associated with an upper respiratory infection are nasal discharge or congestion, cough, sneezing, headache and pressure in the ears and face.  These symptoms usually persist for about 3 to 10 days, but can last up to 2 weeks.  It is important to know that upper respiratory infections do not cause serious illness or complications in most cases.    Upper respiratory infections can be transmitted from person to person, with the most common method of transmission being a person's hands.  The virus is able to live on the skin and can infect other persons for up to 2 hours after direct contact.  Also, these can be transmitted when someone coughs or sneezes; thus, it is important to cover the mouth to reduce this risk.  To keep the spread of the illness at bay, good hand hygiene is very important.  This is an infection that is most likely caused by a virus. There are no specific treatments other than to help you with the symptoms until the infection runs its course.  We are sorry you are not feeling well.  Here is how we plan to help!   For nasal congestion, you may use an oral decongestants such as Mucinex D or if you have glaucoma or  high blood pressure use plain Mucinex.  Saline nasal spray or nasal drops can help and can safely be used as often as needed for congestion. Try using saline irrigation, such as with a neti pot, several times a day while you are sick. Many neti pots come with salt packets premeasured to use to make saline. If you use your own salt, make sure it is kosher salt or sea salt (don't use table salt as it has iodine in it and you don't need that in your nose). Use distilled water to make saline. If you mix your own saline using your own salt, the recipe is 1/4 teaspoon salt in 1 cup warm water. Using saline irrigation can help prevent and treat sinus infections.  For your congestion, I have prescribed Azelastine nasal spray two sprays in each nostril twice a day  If you do not have a history of heart disease, hypertension, diabetes or thyroid disease, prostate/bladder issues or glaucoma, you may also use Sudafed to treat nasal congestion.  It is highly recommended that you consult with a pharmacist or your primary care physician to ensure this medication is safe for you to take.     If you have a cough, you may use cough suppressants such as Delsym and Robitussin.  If you have glaucoma or high blood pressure, you can also use Coricidin HBP.   For  cough I have prescribed for you A prescription cough medication called Tessalon Perles 100 mg. You may take 1-2 capsules every 8 hours as needed for cough  If you have a sore or scratchy throat, use a saltwater gargle-  to  teaspoon of salt dissolved in a 4-ounce to 8-ounce glass of warm water.  Gargle the solution for approximately 15-30 seconds and then spit.  It is important not to swallow the solution.  You can also use throat lozenges/cough drops and Chloraseptic spray to help with throat pain or discomfort.  Warm or cold liquids can also be helpful in relieving throat pain.  For headache, pain or general discomfort, you can use Ibuprofen or Tylenol as directed.    Some authorities believe that zinc sprays or the use of Echinacea may shorten the course of your symptoms.   HOME CARE Only take medications as instructed by your medical team. Be sure to drink plenty of fluids. Water is fine as well as fruit juices, sodas and electrolyte beverages. You may want to stay away from caffeine or alcohol. If you are nauseated, try taking small sips of liquids. How do you know if you are getting enough fluid? Your urine should be a pale yellow or almost colorless. Get rest. Taking a steamy shower or using a humidifier may help nasal congestion and ease sore throat pain. You can place a towel over your head and breathe in the steam from hot water coming from a faucet. Using a saline nasal spray works much the same way. Cough drops, hard candies and sore throat lozenges may ease your cough. Avoid close contacts especially the very young and the elderly Cover your mouth if you cough or sneeze Always remember to wash your hands.   GET HELP RIGHT AWAY IF: You develop worsening fever. If your symptoms do not improve within 10 days You develop yellow or green discharge from your nose over 3 days. You have coughing fits You develop a severe head ache or visual changes. You develop shortness of breath, difficulty breathing or start having chest pain Your symptoms persist after you have completed your treatment plan  MAKE SURE YOU  Understand these instructions. Will watch your condition. Will get help right away if you are not doing well or get worse.  Thank you for choosing an e-visit.  Your e-visit answers were reviewed by a board certified advanced clinical practitioner to complete your personal care plan. Depending upon the condition, your plan could have included both over the counter or prescription medications.  Please review your pharmacy choice. Make sure the pharmacy is open so you can pick up prescription now. If there is a problem, you may contact your  provider through [employer] and have the prescription routed to another pharmacy.  Your safety is important to [place]. If you have drug allergies check your prescription carefully.   For the next 24 hours you can use MyChart to ask questions about today's visit, request a non-urgent call back, or ask for a work or school excuse. You will get an email in the next two days asking about your experience. I hope that your e-visit has been valuable and will speed your recovery.  I have spent 5 minutes in review of e-visit questionnaire, review and updating patient chart, medical decision making and response to patient.   Rica Mast, PhD, FNP-BC

## 2022-05-18 ENCOUNTER — Other Ambulatory Visit: Payer: Self-pay | Admitting: Family Medicine

## 2022-05-18 NOTE — Telephone Encounter (Signed)
ERx 

## 2022-05-18 NOTE — Telephone Encounter (Signed)
Name of Medication: Lorazepam Name of Pharmacy: [facility] Last Fill or Written Date and Quantity: 02/24/22, #30 Last Office Visit and Type: 11/09/21, CPE Next Office Visit and Type: none Last Controlled Substance Agreement Date: none Last UDS: none

## 2022-05-22 ENCOUNTER — Other Ambulatory Visit: Payer: Self-pay | Admitting: Family Medicine

## 2022-06-05 ENCOUNTER — Other Ambulatory Visit: Payer: Self-pay | Admitting: Family Medicine

## 2022-07-14 ENCOUNTER — Other Ambulatory Visit: Payer: Self-pay | Admitting: Family Medicine

## 2022-07-15 NOTE — Telephone Encounter (Signed)
Please call patient and schedule follow-up visit. Send back to CMA for refill after appointment is scheduled.

## 2022-07-15 NOTE — Telephone Encounter (Signed)
LVM for patient to call and schedule

## 2022-07-21 NOTE — Telephone Encounter (Signed)
Noted. Mailed a letter.   E-scribed refill.

## 2022-07-31 ENCOUNTER — Other Ambulatory Visit: Payer: Self-pay | Admitting: Family Medicine

## 2022-08-01 NOTE — Telephone Encounter (Signed)
Refill request Lorazepam Last refill 05/18/22 #30 Last office visit 12/01/21 No upcoming appointment scheduled

## 2022-08-01 NOTE — Telephone Encounter (Signed)
ERx 

## 2022-10-25 ENCOUNTER — Other Ambulatory Visit: Payer: Self-pay | Admitting: Family Medicine

## 2022-10-25 DIAGNOSIS — F411 Generalized anxiety disorder: Secondary | ICD-10-CM

## 2022-10-25 NOTE — Telephone Encounter (Signed)
E-scribed refill.  Plz schedule CPE and lab visits to prevent delays in future refills.  

## 2022-10-25 NOTE — Telephone Encounter (Signed)
Lvmtcb, sent mychart message  

## 2022-10-27 ENCOUNTER — Other Ambulatory Visit: Payer: Self-pay | Admitting: Family Medicine

## 2022-10-28 NOTE — Telephone Encounter (Signed)
Name of Medication: Lorazepam Name of Pharmacy: [facility] Last Fill or Written Date and Quantity: 08/01/22, #30 Last Office Visit and Type: 12/01/21, R foot pain Next Office Visit and Type: 01/09/23, CPE Last Controlled Substance Agreement Date: none Last UDS: none

## 2022-10-31 NOTE — Telephone Encounter (Signed)
ERx 

## 2022-11-24 ENCOUNTER — Other Ambulatory Visit: Payer: Self-pay | Admitting: Family Medicine

## 2022-11-24 DIAGNOSIS — I10 Essential (primary) hypertension: Secondary | ICD-10-CM

## 2022-12-27 ENCOUNTER — Other Ambulatory Visit: Payer: Self-pay | Admitting: Family Medicine

## 2022-12-28 NOTE — Telephone Encounter (Signed)
Refill request Lorazepam Last refill 10/31/22 #30/3 Last office visit 12/01/21 Upcoming appointment 01/09/23

## 2022-12-30 NOTE — Telephone Encounter (Signed)
ERx 

## 2023-01-01 ENCOUNTER — Other Ambulatory Visit: Payer: Self-pay | Admitting: Family Medicine

## 2023-01-01 DIAGNOSIS — R739 Hyperglycemia, unspecified: Secondary | ICD-10-CM

## 2023-01-01 DIAGNOSIS — Z125 Encounter for screening for malignant neoplasm of prostate: Secondary | ICD-10-CM

## 2023-01-01 DIAGNOSIS — E7800 Pure hypercholesterolemia, unspecified: Secondary | ICD-10-CM

## 2023-01-02 ENCOUNTER — Other Ambulatory Visit: Payer: BC Managed Care – PPO

## 2023-01-04 ENCOUNTER — Other Ambulatory Visit (INDEPENDENT_AMBULATORY_CARE_PROVIDER_SITE_OTHER): Payer: BC Managed Care – PPO

## 2023-01-04 DIAGNOSIS — E7800 Pure hypercholesterolemia, unspecified: Secondary | ICD-10-CM

## 2023-01-04 DIAGNOSIS — Z125 Encounter for screening for malignant neoplasm of prostate: Secondary | ICD-10-CM | POA: Diagnosis not present

## 2023-01-04 DIAGNOSIS — R739 Hyperglycemia, unspecified: Secondary | ICD-10-CM

## 2023-01-04 LAB — LIPID PANEL
Cholesterol: 195 mg/dL (ref 0–200)
HDL: 73.2 mg/dL (ref >39.00)
LDL Cholesterol: 102 mg/dL — ABNORMAL HIGH (ref 0–99)
NonHDL: 121.52
Total CHOL/HDL Ratio: 3
Triglycerides: 98 mg/dL (ref 0.0–149.0)
VLDL: 19.6 mg/dL (ref 0.0–40.0)

## 2023-01-04 LAB — COMPREHENSIVE METABOLIC PANEL
ALT: 24 U/L (ref 0–53)
AST: 16 U/L (ref 0–37)
Albumin: 4.4 g/dL (ref 3.5–5.2)
Alkaline Phosphatase: 55 U/L (ref 39–117)
BUN: 21 mg/dL (ref 6–23)
CO2: 24 mEq/L (ref 19–32)
Calcium: 8.9 mg/dL (ref 8.4–10.5)
Chloride: 101 mEq/L (ref 96–112)
Creatinine, Ser: 1.23 mg/dL (ref 0.40–1.50)
GFR: 64.64 mL/min (ref >60.00)
Glucose, Bld: 131 mg/dL — ABNORMAL HIGH (ref 70–99)
Potassium: 4 mEq/L (ref 3.5–5.1)
Sodium: 134 mEq/L — ABNORMAL LOW (ref 135–145)
Total Bilirubin: 0.5 mg/dL (ref 0.2–1.2)
Total Protein: 7 g/dL (ref 6.0–8.3)

## 2023-01-04 LAB — PSA: PSA: 0.23 ng/mL (ref 0.10–4.00)

## 2023-01-04 LAB — HEMOGLOBIN A1C: Hgb A1c MFr Bld: 5.7 % (ref 4.6–6.5)

## 2023-01-07 ENCOUNTER — Other Ambulatory Visit: Payer: Self-pay | Admitting: Family Medicine

## 2023-01-07 DIAGNOSIS — I10 Essential (primary) hypertension: Secondary | ICD-10-CM

## 2023-01-09 ENCOUNTER — Ambulatory Visit (INDEPENDENT_AMBULATORY_CARE_PROVIDER_SITE_OTHER): Payer: BC Managed Care – PPO | Admitting: Family Medicine

## 2023-01-09 ENCOUNTER — Encounter: Payer: Self-pay | Admitting: Family Medicine

## 2023-01-09 VITALS — BP 142/88 | HR 85 | Temp 97.6°F | Ht 64.5 in | Wt 189.4 lb

## 2023-01-09 DIAGNOSIS — F411 Generalized anxiety disorder: Secondary | ICD-10-CM

## 2023-01-09 DIAGNOSIS — R7303 Prediabetes: Secondary | ICD-10-CM

## 2023-01-09 DIAGNOSIS — Z23 Encounter for immunization: Secondary | ICD-10-CM | POA: Diagnosis not present

## 2023-01-09 DIAGNOSIS — Z1211 Encounter for screening for malignant neoplasm of colon: Secondary | ICD-10-CM

## 2023-01-09 DIAGNOSIS — M1812 Unilateral primary osteoarthritis of first carpometacarpal joint, left hand: Secondary | ICD-10-CM

## 2023-01-09 DIAGNOSIS — I10 Essential (primary) hypertension: Secondary | ICD-10-CM

## 2023-01-09 DIAGNOSIS — Z0000 Encounter for general adult medical examination without abnormal findings: Secondary | ICD-10-CM

## 2023-01-09 DIAGNOSIS — E7800 Pure hypercholesterolemia, unspecified: Secondary | ICD-10-CM

## 2023-01-09 MED ORDER — LISINOPRIL-HYDROCHLOROTHIAZIDE 20-25 MG PO TABS: 1.0000 | ORAL_TABLET | Freq: Every day | ORAL | 4 refills | Status: DC

## 2023-01-09 MED ORDER — AMLODIPINE BESYLATE 5 MG PO TABS: 5.0000 mg | ORAL_TABLET | Freq: Every day | ORAL | 4 refills | Status: DC

## 2023-01-09 MED ORDER — BUSPIRONE HCL 10 MG PO TABS: 10.0000 mg | ORAL_TABLET | Freq: Two times a day (BID) | ORAL | 4 refills | Status: DC

## 2023-01-09 NOTE — Assessment & Plan Note (Signed)
Chronic, stable period on buspar 82m BID with lorazepam PRN. Continue current regimen. Reviewed healthy stress relieving strategies. 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

## 2023-01-09 NOTE — Assessment & Plan Note (Signed)
Chronic, above goal. Continue lisinopril/hctz 20/25, increase amlodipine to 37m daily. Discussed watching for pedal edema.

## 2023-01-09 NOTE — Progress Notes (Signed)
Patient ID: Jonathon Gordon, male    DOB: 04-04-1966, 57 y.o.   MRN: [id number]  This visit was conducted in person.  BP (!) 142/88 (BP Location: Right Arm, Cuff Size: Large)   Pulse 85   Temp 97.6 F (36.4 C) (Temporal)   Ht 5' 4.5" (1.638 m)   Wt 189 lb 6 oz (85.9 kg)   SpO2 97%   BMI 32.00 kg/m    CC: CPE Subjective:   HPI: Jonathon Gordon is a 57 y.o. male presenting on 01/09/2023 for Annual Exam   Work stressors, home stressors - finishing house in [place].  Walking regularly.   HTN - continues amlodipine 2.62m daily, lisinopril/hctz 20/267mdaily. Home readings 13AB-[id number]. One episode of low BP 98/83 4 months ago where he felt ill however recently high readings as per above.   GAD - continues lorazepam 6m17m/2-1 BID PRN along with buspar 45m26mD.   Preventative: Colon cancer screening - iFOB negative 04/2021. Requests GI referral  Prostate cancer screening - yearly PSA  Lung cancer screening - 15 PY hx - not eligible   Flu shot - yearly COVID vaccine Moderna 09/2020, 10/2020, no booster Tdap 2019 Shingrix - discussed  Seat belt use discussed Sunscreen use discussed. No changing moles on skin.  Sleep - averaging 6 hours/night  Ex smoker - quit ~2010  Alcohol - averages 1 beer/day or whiskey  Dentist - due  Eye exam - yearly    Caffeine: 1 cup coffee daily Lives with fiance Amy and step son, 2 dogs Occ: [employer], [employer] ppl Activity: walking regularly  Diet: good water, fruits/vegetables, limits junk food      Relevant past medical, surgical, family and social history reviewed and updated as indicated. Interim medical history since our last visit reviewed. Allergies and medications reviewed and updated. Outpatient Medications Prior to Visit  Medication Sig Dispense Refill   acetaminophen (TYLENOL) 650 MG CR tablet Take 650 mg by mouth. Takes 2-3 in the morning.     albuterol (VENTOLIN HFA) 108 (90 Base) MCG/ACT inhaler TAKE 2  PUFFS BY MOUTH EVERY 6 HOURS AS NEEDED FOR WHEEZE OR SHORTNESS OF BREATH 8.5 each 3   diclofenac Sodium (VOLTAREN) 1 % GEL APPLY 1 APPLICATION TOPICALLY 3 (THREE) TIMES DAILY. 100 g 1   LORazepam (ATIVAN) 1 MG tablet TAKE 1/2 TO 1 TABLET BY MOUTH TWICE A DAY AS NEEDED FOR ANXIETY 30 tablet 1   amLODipine (NORVASC) 2.5 MG tablet TAKE 1 TABLET BY MOUTH EVERY DAY 90 tablet 0   busPIRone (BUSPAR) 10 MG tablet TAKE 1 TABLET BY MOUTH TWICE A DAY 180 tablet 0   lisinopril-hydrochlorothiazide (ZESTORETIC) 20-25 MG tablet Take 1 tablet by mouth daily. 90 tablet 3   azelastine (ASTELIN) 0.1 % nasal spray Place 2 sprays into both nostrils 2 (two) times daily. Use in each nostril as directed 30 mL 0   benzonatate (TESSALON) 100 MG capsule Take 1 capsule (100 mg total) by mouth 2 (two) times daily as needed for cough. 20 capsule 0   Multiple Vitamins-Minerals (ONE-A-DAY MENS 50+ ADVANTAGE PO) Take 1 tablet by mouth daily.     Omega-3 Fatty Acids (OMEGA-3 FISH OIL) 1200 MG CAPS Take 1 capsule by mouth daily.     No facility-administered medications prior to visit.     Per HPI unless specifically indicated in ROS section below Review of Systems  Constitutional:  Negative for activity change, appetite change, chills, fatigue,  fever and unexpected weight change.  HENT:  Negative for hearing loss.   Eyes:  Negative for visual disturbance.  Respiratory:  Positive for cough (occ). Negative for chest tightness, shortness of breath and wheezing.   Cardiovascular:  Negative for chest pain, palpitations and leg swelling.  Gastrointestinal:  Negative for abdominal distention, abdominal pain, blood in stool, constipation, diarrhea, nausea and vomiting.  Genitourinary:  Negative for difficulty urinating and hematuria.  Musculoskeletal:  Negative for arthralgias, myalgias and neck pain.  Skin:  Negative for rash.  Neurological:  Negative for dizziness, seizures, syncope and headaches.  Hematological:  Negative for  adenopathy. Does not bruise/bleed easily.  Psychiatric/Behavioral:  Negative for dysphoric mood. The patient is nervous/anxious (stressors as per hpi).     Objective:  BP (!) 142/88 (BP Location: Right Arm, Cuff Size: Large)   Pulse 85   Temp 97.6 F (36.4 C) (Temporal)   Ht 5' 4.5" (1.638 m)   Wt 189 lb 6 oz (85.9 kg)   SpO2 97%   BMI 32.00 kg/m   Wt Readings from Last 3 Encounters:  01/09/23 189 lb 6 oz (85.9 kg)  12/01/21 195 lb 6 oz (88.6 kg)  11/09/21 194 lb 5 oz (88.1 kg)      Physical Exam Vitals and nursing note reviewed.  Constitutional:      General: He is not in acute distress.    Appearance: Normal appearance. He is well-developed. He is not ill-appearing.  HENT:     Head: Normocephalic and atraumatic.     Right Ear: Hearing, tympanic membrane, ear canal and external ear normal.     Left Ear: Hearing, tympanic membrane, ear canal and external ear normal.     Nose: Nose normal.     Mouth/Throat:     Mouth: Mucous membranes are moist.     Pharynx: Oropharynx is clear. No oropharyngeal exudate or posterior oropharyngeal erythema.  Eyes:     General: No scleral icterus.    Extraocular Movements: Extraocular movements intact.     Conjunctiva/sclera: Conjunctivae normal.     Pupils: Pupils are equal, round, and reactive to light.  Neck:     Thyroid: No thyroid mass or thyromegaly.  Cardiovascular:     Rate and Rhythm: Normal rate and regular rhythm.     Pulses: Normal pulses.          Radial pulses are 2+ on the right side and 2+ on the left side.     Heart sounds: Normal heart sounds. No murmur heard. Pulmonary:     Effort: Pulmonary effort is normal. No respiratory distress.     Breath sounds: Normal breath sounds. No wheezing, rhonchi or rales.  Abdominal:     General: Bowel sounds are normal. There is no distension.     Palpations: Abdomen is soft. There is no mass.     Tenderness: There is no abdominal tenderness. There is no guarding or rebound.      Hernia: No hernia is present.  Musculoskeletal:        General: Normal range of motion.     Cervical back: Normal range of motion and neck supple.     Right lower leg: No edema.     Left lower leg: No edema.  Lymphadenopathy:     Cervical: No cervical adenopathy.  Skin:    General: Skin is warm and dry.     Findings: No rash.  Neurological:     General: No focal deficit present.  Mental Status: He is alert and oriented to person, place, and time.  Psychiatric:        Mood and Affect: Mood normal.        Behavior: Behavior normal.        Thought Content: Thought content normal.        Judgment: Judgment normal.       Results for orders placed or performed in visit on 01/04/23  PSA  Result Value Ref Range   PSA 0.23 0.10 - 4.00 ng/mL  Hemoglobin A1c  Result Value Ref Range   Hgb A1c MFr Bld 5.7 4.6 - 6.5 %  Comprehensive metabolic panel  Result Value Ref Range   Sodium 134 (L) 135 - 145 mEq/L   Potassium 4.0 3.5 - 5.1 mEq/L   Chloride 101 96 - 112 mEq/L   CO2 24 19 - 32 mEq/L   Glucose, Bld 131 (H) 70 - 99 mg/dL   BUN 21 6 - 23 mg/dL   Creatinine, Ser 1.23 0.40 - 1.50 mg/dL   Total Bilirubin 0.5 0.2 - 1.2 mg/dL   Alkaline Phosphatase 55 39 - 117 U/L   AST 16 0 - 37 U/L   ALT 24 0 - 53 U/L   Total Protein 7.0 6.0 - 8.3 g/dL   Albumin 4.4 3.5 - 5.2 g/dL   GFR 64.64 >60.00 mL/min   Calcium 8.9 8.4 - 10.5 mg/dL  Lipid panel  Result Value Ref Range   Cholesterol 195 0 - 200 mg/dL   Triglycerides 98.0 0.0 - 149.0 mg/dL   HDL 73.20 >39.00 mg/dL   VLDL 19.6 0.0 - 40.0 mg/dL   LDL Cholesterol 102 (H) 0 - 99 mg/dL   Total CHOL/HDL Ratio 3    NonHDL 121.52       01/09/2023    9:38 AM 11/09/2021    3:40 PM 11/09/2021    3:14 PM 05/03/2021    8:43 AM 06/19/2018    4:20 PM  Depression screen PHQ 2/9  Decreased Interest 1 1 0 0 0  Down, Depressed, Hopeless 1 1 0 0 0  PHQ - 2 Score 2 2 0 0 0  Altered sleeping 1 1  0   Tired, decreased energy 1 2  0   Change in  appetite 0 0  0   Feeling bad or failure about yourself  1 0  0   Trouble concentrating 0 0  0   Moving slowly or fidgety/restless 0 0  0   Suicidal thoughts 0 0  0   PHQ-9 Score 5 5  0   Difficult doing work/chores Not difficult at all   Not difficult at all        01/09/2023    9:38 AM 11/09/2021    3:40 PM 10/28/2019    4:26 PM 05/18/2018    8:51 AM  GAD 7 : Generalized Anxiety Score  Nervous, Anxious, on Edge 3 1 3 2  $ Control/stop worrying 3 1 3 2  $ Worry too much - different things 3 1 3 2  $ Trouble relaxing 1 1 3 2  $ Restless 0 0 2 1  Easily annoyed or irritable 1 1 2 2  $ Afraid - awful might happen 2 1 3 3  $ Total GAD 7 Score 13 6 19 14  $ Anxiety Difficulty Not difficult at all       Assessment & Plan:   Problem List Items Addressed This Visit     Health maintenance examination - Primary (Chronic)  Preventative protocols reviewed and updated unless pt declined. Discussed healthy diet and lifestyle.       Hypertension, essential    Chronic, above goal. Continue lisinopril/hctz 20/25, increase amlodipine to 34m daily. Discussed watching for pedal edema.       Relevant Medications   amLODipine (NORVASC) 5 MG tablet   lisinopril-hydrochlorothiazide (ZESTORETIC) 20-25 MG tablet   GAD (generalized anxiety disorder)    Chronic, stable period on buspar 1770mBID with lorazepam PRN. Continue current regimen. Reviewed healthy stress relieving strategies. 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       Relevant Medications   busPIRone (BUSPAR) 10 MG tablet   Prediabetes    Reviewed limiting added sugar in diet.       Arthritis of carpometacarpal (CMC) joint of left thumb    Ongoing discomfort. Hypertrophy noted on exam.  Rec voltaren gel topically, consider hand surgery eval if ongoing, worsening pain.       Hyperlipidemia    Chronic, off statin. Encouraged diet choices to control lipid levels. The 10-year ASCVD risk score (Arnett DK, et al., 2019)  is: 7.4%   Values used to calculate the score:     Age: [age]     Sex: Male     Is Non-Hispanic African American: No     Diabetic: No     Tobacco smoker: No     Systolic Blood Pressure: 14A999333mHg     Is BP treated: Yes     HDL Cholesterol: 73.2 mg/dL     Total Cholesterol: 195 mg/dL       Relevant Medications   amLODipine (NORVASC) 5 MG tablet   lisinopril-hydrochlorothiazide (ZESTORETIC) 20-25 MG tablet   Other Visit Diagnoses     Special screening for malignant neoplasms, colon       Relevant Orders   Ambulatory referral to Gastroenterology   Need for influenza vaccination       Relevant Orders   Flu Vaccine QUAD 70m49mo (Fluarix, Fluzone & Alfiuria Quad PF) (Completed)        Meds ordered this encounter  Medications   amLODipine (NORVASC) 5 MG tablet    Sig: Take 1 tablet (5 mg total) by mouth daily.    Dispense:  90 tablet    Refill:  4    Note new dose   lisinopril-hydrochlorothiazide (ZESTORETIC) 20-25 MG tablet    Sig: Take 1 tablet by mouth daily.    Dispense:  90 tablet    Refill:  4   busPIRone (BUSPAR) 10 MG tablet    Sig: Take 1 tablet (10 mg total) by mouth 2 (two) times daily.    Dispense:  180 tablet    Refill:  4    Orders Placed This Encounter  Procedures   Flu Vaccine QUAD 70mo8mo(Fluarix, Fluzone & Alfiuria Quad PF)   Ambulatory referral to Gastroenterology    Referral Priority:   Routine    Referral Type:   Consultation    Referral Reason:   Specialty Services Required    Number of Visits Requested:   1    Patient Instructions  Flu shot today  Return at your convenience for nurse visit for shingles shot.  [place]  Increase amlodipine to 5mg 81mly.  Try voltaren gel to left thumb. If ongoing or worsening pain, [place] for [place] to hand doctor.  You are doing well today Return as needed or in  1 year for next physical.   Follow up plan: Return in about 1  year (around 01/10/2024) for annual exam, prior fasting for blood work.  Ria Bush, MD

## 2023-01-09 NOTE — Assessment & Plan Note (Signed)
Ongoing discomfort. Hypertrophy noted on exam.  Rec voltaren gel topically, consider hand surgery eval if ongoing, worsening pain.

## 2023-01-09 NOTE — Patient Instructions (Addendum)
Flu shot today  Return at your convenience for nurse visit for shingles shot.  We will refer you for colonoscopy in [place].  Continue walking regularly.  Increase amlodipine to 71m daily.  Try voltaren gel to left thumb. If ongoing or worsening pain, let [place] for referral to hand doctor.  You are doing well today Return as needed or in 1 year for next physical.

## 2023-01-09 NOTE — Assessment & Plan Note (Signed)
Chronic, off statin. Encouraged diet choices to control lipid levels. The 10-year ASCVD risk score (Arnett DK, et al., 2019) is: 7.4%   Values used to calculate the score:     Age: 57 years     Sex: Male     Is Non-Hispanic African American: No     Diabetic: No     Tobacco smoker: No     Systolic Blood Pressure: [id number] mmHg     Is BP treated: Yes     HDL Cholesterol: 73.2 mg/dL     Total Cholesterol: 195 mg/dL

## 2023-01-09 NOTE — Assessment & Plan Note (Signed)
Preventative protocols reviewed and updated unless pt declined. Discussed healthy diet and lifestyle.  

## 2023-01-09 NOTE — Assessment & Plan Note (Signed)
Reviewed limiting added sugar in diet.

## 2023-01-10 NOTE — Telephone Encounter (Signed)
Rx sent at 01/09/23 OV.  Duplicate request denied.

## 2023-01-16 ENCOUNTER — Encounter: Payer: Self-pay | Admitting: *Deleted

## 2023-04-18 ENCOUNTER — Encounter: Payer: Self-pay | Admitting: Family Medicine

## 2023-04-18 DIAGNOSIS — F411 Generalized anxiety disorder: Secondary | ICD-10-CM

## 2023-04-18 DIAGNOSIS — F439 Reaction to severe stress, unspecified: Secondary | ICD-10-CM

## 2023-04-26 NOTE — Addendum Note (Signed)
Addended by: Eustaquio Boyden on: 04/26/2023 05:29 PM   Modules accepted: Orders

## 2023-05-15 ENCOUNTER — Telehealth: Payer: Self-pay

## 2023-05-15 NOTE — Telephone Encounter (Signed)
Pt scheduled MyChart video visit tomorrow at 4:00 to discuss med changes.   Lvm asking pt to call back. Need reason pt cannot come into office for visit.

## 2023-05-16 ENCOUNTER — Encounter: Payer: Self-pay | Admitting: Family Medicine

## 2023-05-16 ENCOUNTER — Telehealth: Payer: BC Managed Care – PPO | Admitting: Family Medicine

## 2023-05-16 VITALS — Ht 64.5 in | Wt 177.0 lb

## 2023-05-16 DIAGNOSIS — Z1211 Encounter for screening for malignant neoplasm of colon: Secondary | ICD-10-CM | POA: Diagnosis not present

## 2023-05-16 DIAGNOSIS — F411 Generalized anxiety disorder: Secondary | ICD-10-CM | POA: Diagnosis not present

## 2023-05-16 MED ORDER — ALBUTEROL SULFATE HFA 108 (90 BASE) MCG/ACT IN AERS: INHALATION_SPRAY | RESPIRATORY_TRACT | 3 refills | Status: AC

## 2023-05-16 MED ORDER — LORAZEPAM 1 MG PO TABS: 0.5000 mg | ORAL_TABLET | Freq: Every day | ORAL | 0 refills | Status: DC | PRN

## 2023-05-16 MED ORDER — ESCITALOPRAM OXALATE 5 MG PO TABS
5.0000 mg | ORAL_TABLET | Freq: Every day | ORAL | 6 refills | Status: DC
Start: 2023-05-16 — End: 2023-06-09

## 2023-05-16 MED ORDER — BUSPIRONE HCL 10 MG PO TABS
10.0000 mg | ORAL_TABLET | Freq: Every day | ORAL | 1 refills | Status: DC
Start: 2023-05-16 — End: 2024-06-28

## 2023-05-16 NOTE — Progress Notes (Signed)
Ph: [phone] Fax: [phone]   Patient ID: Jonathon Gordon, male    DOB: 16-Apr-1966, 57 y.o.   MRN: [id number]  Virtual visit completed through MyChart, a video enabled telemedicine application. Due to national recommendations of social distancing due to COVID-19, a virtual visit is felt to be most appropriate for this patient at this time. Reviewed limitations, risks, security and privacy concerns of performing a virtual visit and the availability of in person appointments. I also reviewed that there may be a patient responsible charge related to this service. The patient agreed to proceed.   Patient location: home Provider location: [place] at [facility], office Persons participating in this virtual visit: patient, provider   If any vitals were documented, they were collected by patient at home unless specified below.    Ht 5' 4.5" (1.638 m)   Wt 177 lb (80.3 kg)   BMI 29.91 kg/m    CC: discuss medications Subjective:   HPI: Jonathon Gordon is a 57 y.o. male presenting on 05/16/2023 for Discuss Medications (Wants to discuss possibly changing mood meds. )   Longstanding GAD on lorazepam 1mg  1/2 tab every day PRN along with buspar 10mg  - only taking QAM.  Drinking 2 beers at night.   Work stressors, some family stress. Feels like has good communication with wife.  Pending referral to counseling/therapy.   Ongoing weight loss noted. Intentional - working on diet and activity.  Overdue for colon cancer screening.  New house in [place] - using for AirB[employer]B. Also has house in [place].   Requests stool kit mailed today.      Relevant past medical, surgical, family and social history reviewed and updated as indicated. Interim medical history since our last visit reviewed. Allergies and medications reviewed and updated. Outpatient Medications Prior to Visit  Medication Sig Dispense Refill   acetaminophen (TYLENOL) 650 MG CR tablet Take 650 mg by mouth. Takes 2-3 in the  morning.     amLODipine (NORVASC) 5 MG tablet Take 1 tablet (5 mg total) by mouth daily. 90 tablet 4   diclofenac Sodium (VOLTAREN) 1 % GEL APPLY 1 APPLICATION TOPICALLY 3 (THREE) TIMES DAILY. 100 g 1   lisinopril-hydrochlorothiazide (ZESTORETIC) 20-25 MG tablet Take 1 tablet by mouth daily. 90 tablet 4   albuterol (VENTOLIN HFA) 108 (90 Base) MCG/ACT inhaler TAKE 2 PUFFS BY MOUTH EVERY 6 HOURS AS NEEDED FOR WHEEZE OR SHORTNESS OF BREATH 8.5 each 3   busPIRone (BUSPAR) 10 MG tablet Take 1 tablet (10 mg total) by mouth 2 (two) times daily. (Patient taking differently: Take 10 mg by mouth 2 (two) times daily. Takes once a day) 180 tablet 4   LORazepam (ATIVAN) 1 MG tablet TAKE 1/2 TO 1 TABLET BY MOUTH TWICE A DAY AS NEEDED FOR ANXIETY 30 tablet 1   No facility-administered medications prior to visit.     Per HPI unless specifically indicated in ROS section below Review of Systems Objective:  Ht 5' 4.5" (1.638 m)   Wt 177 lb (80.3 kg)   BMI 29.91 kg/m   Wt Readings from Last 3 Encounters:  05/16/23 177 lb (80.3 kg)  01/09/23 189 lb 6 oz (85.9 kg)  12/01/21 195 lb 6 oz (88.6 kg)       Physical exam: Gen: alert, NAD, not ill appearing Pulm: speaks in complete sentences without increased work of breathing Psych: normal mood, normal thought content         01/09/2023    9:38 AM 11/09/2021  3:40 PM 11/09/2021    3:14 PM 05/03/2021    8:43 AM 06/19/2018    4:20 PM  Depression screen PHQ 2/9  Decreased Interest 1 1 0 0 0  Down, Depressed, Hopeless 1 1 0 0 0  PHQ - 2 Score 2 2 0 0 0  Altered sleeping 1 1  0   Tired, decreased energy 1 2  0   Change in appetite 0 0  0   Feeling bad or failure about yourself  1 0  0   Trouble concentrating 0 0  0   Moving slowly or fidgety/restless 0 0  0   Suicidal thoughts 0 0  0   PHQ-9 Score 5 5  0   Difficult doing work/chores Not difficult at all   Not difficult at all        01/09/2023    9:38 AM 11/09/2021    3:40 PM 10/28/2019    4:26  PM 05/18/2018    8:51 AM  GAD 7 : Generalized Anxiety Score  Nervous, Anxious, on Edge 3 1 3 2   Control/stop worrying 3 1 3 2   Worry too much - different things 3 1 3 2   Trouble relaxing 1 1 3 2   Restless 0 0 2 1  Easily annoyed or irritable 1 1 2 2   Afraid - awful might happen 2 1 3 3   Total GAD 7 Score 13 6 19 14   Anxiety Difficulty Not difficult at all      Assessment & Plan:   GAD (generalized anxiety disorder) Assessment & Plan: Chronic, deteriorated period with increased stressors. Only taking buspar 10mg  in am with lorazepam 1/2 tab PRN.  Pending counseling - anticipate this will be helpful.  Will continue lorazepam PRN, change buspar to 1mg  nightly, add lexapro 5mg  in am. Reviewed routine precautions with antidepressant/SSRI including common side effects and rare risk of increased suicidality.  Update with effect.   Orders: -     busPIRone HCl; Take 1 tablet (10 mg total) by mouth at bedtime.  Dispense: 90 tablet; Refill: 1  Special screening for malignant neoplasms, colon -     Fecal occult blood, imunochemical; Future  Other orders -     Escitalopram Oxalate; Take 1 tablet (5 mg total) by mouth daily.  Dispense: 30 tablet; Refill: 6 -     LORazepam; Take 0.5-1 tablets (0.5-1 mg total) by mouth daily as needed for anxiety.  Dispense: 30 tablet; Refill: 0 -     Albuterol Sulfate HFA; TAKE 2 PUFFS BY MOUTH EVERY 6 HOURS AS NEEDED FOR WHEEZE OR SHORTNESS OF BREATH  Dispense: 8.5 each; Refill: 3     I discussed the assessment and treatment plan with the patient. The patient was provided an opportunity to ask questions and all were answered. The patient agreed with the plan and demonstrated an understanding of the instructions. The patient was advised to call back or seek an in-person evaluation if the symptoms worsen or if the condition fails to improve as anticipated.  Follow up plan: No follow-ups on file.  Eustaquio Boyden, MD

## 2023-05-16 NOTE — Assessment & Plan Note (Signed)
Chronic, deteriorated period with increased stressors. Only taking buspar 10mg  in am with lorazepam 1/2 tab PRN.  Pending counseling - anticipate this will be helpful.  Will continue lorazepam PRN, change buspar to 1mg  nightly, add lexapro 5mg  in am. Reviewed routine precautions with antidepressant/SSRI including common side effects and rare risk of increased suicidality.  Update with effect.

## 2023-05-16 NOTE — Telephone Encounter (Signed)
Left message on vm per dpr informing pt Dr. Reece Agar notified me he had already ok'd the video visit.

## 2023-06-09 ENCOUNTER — Other Ambulatory Visit: Payer: Self-pay | Admitting: Family Medicine

## 2023-06-09 NOTE — Telephone Encounter (Signed)
Message from pharmacy:  REQUEST FOR 90 DAYS PRESCRIPTION.   Lexapro Last filled:  05/16/23, #30 Last OV:  05/16/23, GAD f/u Next OV:  none

## 2023-08-25 ENCOUNTER — Other Ambulatory Visit: Payer: Self-pay | Admitting: Family Medicine

## 2023-08-25 DIAGNOSIS — Z1211 Encounter for screening for malignant neoplasm of colon: Secondary | ICD-10-CM

## 2023-08-25 DIAGNOSIS — Z1212 Encounter for screening for malignant neoplasm of rectum: Secondary | ICD-10-CM

## 2023-10-06 DIAGNOSIS — Z1212 Encounter for screening for malignant neoplasm of rectum: Secondary | ICD-10-CM | POA: Diagnosis not present

## 2023-10-06 DIAGNOSIS — Z1211 Encounter for screening for malignant neoplasm of colon: Secondary | ICD-10-CM | POA: Diagnosis not present

## 2023-10-14 ENCOUNTER — Other Ambulatory Visit: Payer: Self-pay | Admitting: Family Medicine

## 2023-10-14 DIAGNOSIS — R195 Other fecal abnormalities: Secondary | ICD-10-CM

## 2023-10-14 LAB — COLOGUARD: COLOGUARD: POSITIVE — AB

## 2023-10-18 ENCOUNTER — Other Ambulatory Visit: Payer: Self-pay | Admitting: Family Medicine

## 2023-10-18 NOTE — Telephone Encounter (Signed)
Refill request for  LORazepam (ATIVAN) 1 MG tablet   LOV - 05/16/23 Next OV - not scheduled Last refill - 05/16/23 #30/0

## 2023-10-18 NOTE — Telephone Encounter (Signed)
ERx

## 2023-10-30 ENCOUNTER — Encounter: Payer: Self-pay | Admitting: *Deleted

## 2024-01-11 ENCOUNTER — Other Ambulatory Visit: Payer: Self-pay | Admitting: Family Medicine

## 2024-01-11 DIAGNOSIS — I10 Essential (primary) hypertension: Secondary | ICD-10-CM

## 2024-01-11 NOTE — Telephone Encounter (Signed)
 Lvmtcb

## 2024-01-11 NOTE — Telephone Encounter (Signed)
E-scribed refills.  Plz schedule CPE and fasting lab (no food/drink- except water and/or blk coffee 5 hrs prior) visits for additional refills.  

## 2024-02-07 ENCOUNTER — Other Ambulatory Visit: Payer: Self-pay | Admitting: Family Medicine

## 2024-02-07 DIAGNOSIS — F411 Generalized anxiety disorder: Secondary | ICD-10-CM

## 2024-02-07 NOTE — Telephone Encounter (Signed)
 Name of Medication:  Lorazepam Name of Pharmacy:  [facility] Last Fill or Written Date and Quantity:  10/18/23, #30 Last Office Visit and Type:  05/16/23, mood Next Office Visit and Type:  none Last Controlled Substance Agreement Date:  none Last UDS:  none

## 2024-02-07 NOTE — Telephone Encounter (Signed)
 ERx

## 2024-04-10 ENCOUNTER — Other Ambulatory Visit: Payer: Self-pay | Admitting: Family Medicine

## 2024-04-10 DIAGNOSIS — I10 Essential (primary) hypertension: Secondary | ICD-10-CM

## 2024-05-29 ENCOUNTER — Other Ambulatory Visit: Payer: Self-pay

## 2024-05-29 DIAGNOSIS — I10 Essential (primary) hypertension: Secondary | ICD-10-CM

## 2024-05-29 NOTE — Telephone Encounter (Signed)
 Re[facility] [place].

## 2024-05-30 MED ORDER — LISINOPRIL-HYDROCHLOROTHIAZIDE 20-25 MG PO TABS: 1.0000 | ORAL_TABLET | Freq: Every day | ORAL | 0 refills | Status: DC

## 2024-06-14 ENCOUNTER — Other Ambulatory Visit: Payer: Self-pay | Admitting: Family Medicine

## 2024-06-20 ENCOUNTER — Other Ambulatory Visit: Payer: Self-pay

## 2024-06-20 DIAGNOSIS — F411 Generalized anxiety disorder: Secondary | ICD-10-CM

## 2024-06-20 NOTE — Telephone Encounter (Signed)
 Name of Medication: LORazepam  (ATIVAN ) 1 MG tablet  Name of [facility]: [facility]  Last Fill or Written Date and Quantity: 02/07/24 #30  Last Office Visit and Type: Virtual f/u on gad 05/16/23 Next Office Visit and Type: 06/28/24 Last Controlled Substance Agreement Date: none  Last LID:[id number]

## 2024-06-23 MED ORDER — LORAZEPAM 1 MG PO TABS: 0.5000 mg | ORAL_TABLET | Freq: Every day | ORAL | 0 refills | Status: DC | PRN

## 2024-06-23 NOTE — Telephone Encounter (Signed)
 ERx

## 2024-06-27 NOTE — Progress Notes (Unsigned)
 Ph: [phone] Fax: [phone]   Patient ID: Jonathon Gordon, male    DOB: 02-26-1966, 58 y.o.   MRN: [id number]  This visit was conducted in person.  There were no vitals taken for this visit.   CC: CPE Subjective:   HPI: Jonathon Gordon is a 58 y.o. male presenting on 06/28/2024 for No chief complaint on file.   Preventative: Colon cancer screening - cologuard positive 09/2023 - *** Prostate cancer screening - yearly PSA  Lung cancer screening - 15 PY hx - not eligible   Flu shot - yearly COVID vaccine Moderna 09/2020, 10/2020, no booster Tdap 2019 Shingrix - discussed  Seat belt use discussed Sunscreen use discussed. No changing moles on skin.  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 Interim medical history since our last visit reviewed. Allergies and medications reviewed and updated. Outpatient Medications Prior to Visit  Medication Sig Dispense Refill   acetaminophen (TYLENOL) 650 MG CR tablet Take 650 mg by mouth. Takes 2-3 in the morning.     albuterol  (VENTOLIN  HFA) 108 (90 Base) MCG/ACT inhaler TAKE 2 PUFFS BY MOUTH EVERY 6 HOURS AS NEEDED FOR WHEEZE OR SHORTNESS OF BREATH 8.5 each 3   amLODipine  (NORVASC ) 5 MG tablet TAKE 1 TABLET (5 MG TOTAL) BY MOUTH DAILY. 90 tablet 0   busPIRone  (BUSPAR ) 10 MG tablet Take 1 tablet (10 mg total) by mouth at bedtime. 90 tablet 1   diclofenac  Sodium (VOLTAREN ) 1 % GEL APPLY 1 APPLICATION TOPICALLY 3 (THREE) TIMES DAILY. 100 g 1   escitalopram  (LEXAPRO ) 5 MG tablet TAKE 1 TABLET (5 MG TOTAL) BY MOUTH DAILY. 90 tablet 0    lisinopril -hydrochlorothiazide  (ZESTORETIC ) 20-25 MG tablet Take 1 tablet by mouth daily. 90 tablet 0   LORazepam  (ATIVAN ) 1 MG tablet Take 0.5-1 tablets (0.5-1 mg total) by mouth daily as needed for anxiety. 30 tablet 0   No facility-administered medications prior to visit.     Per HPI unless specifically indicated in ROS section below Review of Systems  Constitutional:  Negative for activity change, appetite change, chills, fatigue, fever and unexpected weight change.  HENT:  Negative for hearing loss.   Eyes:  Negative for visual disturbance.  Respiratory:  Negative for cough, chest tightness, shortness of breath and wheezing.   Cardiovascular:  Negative for chest pain, palpitations and leg swelling.  Gastrointestinal:  Negative for abdominal distention, abdominal pain, blood in stool, constipation, diarrhea, nausea and vomiting.  Genitourinary:  Negative for difficulty urinating and hematuria.  Musculoskeletal:  Negative for arthralgias, myalgias and neck pain.  Skin:  Negative for rash.  Neurological:  Negative for dizziness, seizures, syncope and headaches.  Hematological:  Negative for adenopathy. Does not bruise/bleed easily.  Psychiatric/Behavioral:  Negative for dysphoric mood. The patient is not nervous/anxious.     Objective:  There were no vitals taken for this visit.  Wt Readings from Last 3 Encounters:  05/16/23 177 lb (80.3 kg)  01/09/23 189 lb 6 oz (85.9 kg)  12/01/21 195 lb 6 oz (88.6 kg)  Physical Exam Vitals and nursing note reviewed.  Constitutional:      General: He is not in acute distress.    Appearance: Normal appearance. He is well-developed. He is not ill-appearing.  HENT:     Head: Normocephalic and atraumatic.     Right Ear: Hearing, tympanic membrane, ear canal and external ear normal.     Left Ear: Hearing, tympanic membrane, ear canal and external ear normal.     Mouth/Throat:     Mouth: Mucous membranes are moist.     Pharynx: Oropharynx  is clear. No oropharyngeal exudate or posterior oropharyngeal erythema.  Eyes:     General: No scleral icterus.    Extraocular Movements: Extraocular movements intact.     Conjunctiva/sclera: Conjunctivae normal.     Pupils: Pupils are equal, round, and reactive to light.  Neck:     Thyroid: No thyroid mass or thyromegaly.  Cardiovascular:     Rate and Rhythm: Normal rate and regular rhythm.     Pulses: Normal pulses.          Radial pulses are 2+ on the right side and 2+ on the left side.     Heart sounds: Normal heart sounds. No murmur heard. Pulmonary:     Effort: Pulmonary effort is normal. No respiratory distress.     Breath sounds: Normal breath sounds. No wheezing, rhonchi or rales.  Abdominal:     General: Bowel sounds are normal. There is no distension.     Palpations: Abdomen is soft. There is no mass.     Tenderness: There is no abdominal tenderness. There is no guarding or rebound.     Hernia: No hernia is present.  Musculoskeletal:        General: Normal range of motion.     Cervical back: Normal range of motion and neck supple.     Right lower leg: No edema.     Left lower leg: No edema.  Lymphadenopathy:     Cervical: No cervical adenopathy.  Skin:    General: Skin is warm and dry.     Findings: No rash.  Neurological:     General: No focal deficit present.     Mental Status: He is alert and oriented to person, place, and time.  Psychiatric:        Mood and Affect: Mood normal.        Behavior: Behavior normal.        Thought Content: Thought content normal.        Judgment: Judgment normal.       Results for orders placed or performed in visit on 08/25/23  Cologuard   Collection Time: 10/06/23  5:45 AM  Result Value Ref Range   COLOGUARD Positive (A) Negative    Assessment & Plan:   Problem List Items Addressed This Visit   None    No orders of the defined types were placed in this encounter.   No orders of the defined types were placed in  this encounter.   There are no Patient Instructions on file for this visit.  Follow up plan: No follow-ups on file.  Anton Blas, MD

## 2024-06-28 ENCOUNTER — Encounter: Payer: Self-pay | Admitting: Family Medicine

## 2024-06-28 ENCOUNTER — Ambulatory Visit (INDEPENDENT_AMBULATORY_CARE_PROVIDER_SITE_OTHER): Admitting: Family Medicine

## 2024-06-28 VITALS — BP 140/88 | HR 68 | Temp 97.9°F | Ht 64.5 in | Wt 190.5 lb

## 2024-06-28 DIAGNOSIS — E7800 Pure hypercholesterolemia, unspecified: Secondary | ICD-10-CM | POA: Diagnosis not present

## 2024-06-28 DIAGNOSIS — R195 Other fecal abnormalities: Secondary | ICD-10-CM

## 2024-06-28 DIAGNOSIS — R7303 Prediabetes: Secondary | ICD-10-CM

## 2024-06-28 DIAGNOSIS — Z0000 Encounter for general adult medical examination without abnormal findings: Secondary | ICD-10-CM

## 2024-06-28 DIAGNOSIS — Z23 Encounter for immunization: Secondary | ICD-10-CM | POA: Diagnosis not present

## 2024-06-28 DIAGNOSIS — Z125 Encounter for screening for malignant neoplasm of prostate: Secondary | ICD-10-CM | POA: Diagnosis not present

## 2024-06-28 DIAGNOSIS — I10 Essential (primary) hypertension: Secondary | ICD-10-CM

## 2024-06-28 DIAGNOSIS — F411 Generalized anxiety disorder: Secondary | ICD-10-CM

## 2024-06-28 MED ORDER — LISINOPRIL-HYDROCHLOROTHIAZIDE 20-25 MG PO TABS: 1.0000 | ORAL_TABLET | Freq: Every day | ORAL | 3 refills | Status: AC

## 2024-06-28 MED ORDER — AMLODIPINE BESYLATE 5 MG PO TABS: 5.0000 mg | ORAL_TABLET | Freq: Every day | ORAL | 3 refills | Status: AC

## 2024-06-28 NOTE — Assessment & Plan Note (Signed)
Update A1c. Encouraged limiting added sugar in diet ?

## 2024-06-28 NOTE — Assessment & Plan Note (Addendum)
 Chronic, update FLP off statin  No strong fmhx CAD.  Consider cardiac CT.  The 10-year ASCVD risk score (Arnett DK, et al., 2019) is: 8.7%   Values used to calculate the score:     Age: 58 years     Clincally relevant sex: Male     Is Non-Hispanic African American: No     Diabetic: No     Tobacco smoker: No     Systolic Blood Pressure: 140 mmHg     Is BP treated: Yes     HDL Cholesterol: 73.2 mg/dL     Total Cholesterol: 195 mg/dL

## 2024-06-28 NOTE — Patient Instructions (Addendum)
 Labs today  Prevnar-20 today  Keep an eye on blood pressures at home, let me know if consistently >140/90. Try tapering off escitalopram  - every other day for 2 weeks then stop. If you decide to continue, let me know and I'll refill.  We will refer you to [facility] for colonoscopy Good to see you today  Return as needed or in 1 year for next physical

## 2024-06-28 NOTE — Assessment & Plan Note (Addendum)
 Overall stable period for mood. He was able to stop buspar  without significant difficulty. Desires to further taper off lexapro . Only on low dose 5mg  daily - will drop to every other day dosing x2 wks then stop. Update with effect. Refill lorazepam  for sparing PRN use - takes 1/4 tab at a time.

## 2024-06-28 NOTE — Assessment & Plan Note (Signed)
 BP remains above goal despite 3 drug regimen. Intolerance to higher amlodipine  dose. I asked him to start monitoring BP at home and let me know if consistently >140/90 for med titration - likely add BB.

## 2024-06-28 NOTE — Assessment & Plan Note (Addendum)
 Preventative protocols reviewed and updated unless pt declined. Discussed healthy diet and lifestyle.  Pos Cologuard 2024. Was unable to complete colonoscopy last year. Will refer to [facility] for colonoscopy later this year

## 2024-06-29 LAB — COMPREHENSIVE METABOLIC PANEL WITH GFR
AG Ratio: 2 (calc) (ref 1.0–2.5)
ALT: 24 U/L (ref 9–46)
AST: 22 U/L (ref 10–35)
Albumin: 4.9 g/dL (ref 3.6–5.1)
Alkaline phosphatase (APISO): 71 U/L (ref 35–144)
BUN/Creatinine Ratio: 21 (calc) (ref 6–22)
BUN: 28 mg/dL — ABNORMAL HIGH (ref 7–25)
CO2: 24 mmol/L (ref 20–32)
Calcium: 9.2 mg/dL (ref 8.6–10.3)
Chloride: 100 mmol/L (ref 98–110)
Creat: 1.33 mg/dL (ref 0.70–1.35)
Globulin: 2.4 g/dL (ref 1.9–3.7)
Glucose, Bld: 100 mg/dL — ABNORMAL HIGH (ref 65–99)
Potassium: 4.1 mmol/L (ref 3.5–5.3)
Sodium: 136 mmol/L (ref 135–146)
Total Bilirubin: 0.5 mg/dL (ref 0.2–1.2)
Total Protein: 7.3 g/dL (ref 6.1–8.1)
eGFR: 61 mL/min/1.73m2 (ref >=60)

## 2024-06-29 LAB — HEMOGLOBIN A1C
Hgb A1c MFr Bld: 5.6 % (ref <5.7)
Mean Plasma Glucose: 114 mg/dL
eAG (mmol/L): 6.3 mmol/L

## 2024-06-29 LAB — LIPID PANEL
Cholesterol: 198 mg/dL (ref <200)
HDL: 86 mg/dL (ref >=40)
LDL Cholesterol (Calc): 97 mg/dL
Non-HDL Cholesterol (Calc): 112 mg/dL (ref <130)
Total CHOL/HDL Ratio: 2.3 (calc) (ref <5.0)
Triglycerides: 63 mg/dL (ref <150)

## 2024-06-29 LAB — PSA: PSA: 0.31 ng/mL (ref <=4.00)

## 2024-07-01 ENCOUNTER — Ambulatory Visit: Payer: Self-pay | Admitting: Family Medicine

## 2024-07-12 ENCOUNTER — Other Ambulatory Visit: Payer: Self-pay | Admitting: Family Medicine

## 2024-07-12 DIAGNOSIS — I10 Essential (primary) hypertension: Secondary | ICD-10-CM

## 2024-09-18 ENCOUNTER — Other Ambulatory Visit: Payer: Self-pay | Admitting: Family Medicine

## 2024-09-20 NOTE — Telephone Encounter (Signed)
 ERx

## 2024-09-30 ENCOUNTER — Encounter: Payer: Self-pay | Admitting: Family Medicine

## 2024-12-13 ENCOUNTER — Other Ambulatory Visit: Payer: Self-pay | Admitting: Family Medicine

## 2024-12-13 DIAGNOSIS — F411 Generalized anxiety disorder: Secondary | ICD-10-CM

## 2024-12-13 NOTE — Telephone Encounter (Signed)
 Requesting: Ativan   Contract: No UDS:  Last Visit: 06/28/2024 Next Visit: Visit date not found Last Refill: 06/23/2024  Please Advise

## 2024-12-14 NOTE — Telephone Encounter (Signed)
 ERx

## 2025-06-23 ENCOUNTER — Other Ambulatory Visit

## 2025-06-30 ENCOUNTER — Encounter: Admitting: Family Medicine
